# Patient Record
Sex: Male | Born: 1970 | Race: Black or African American | Hispanic: No | Marital: Single | Smoking: Current every day smoker
Health system: Southern US, Community
[De-identification: ages and names within clinical notes are randomized; demographics above are authoritative.]

## PROBLEM LIST (undated history)

## (undated) ENCOUNTER — Emergency Department (HOSPITAL_COMMUNITY): Admission: EM | Payer: Medicaid Other

## (undated) DIAGNOSIS — Z720 Tobacco use: Secondary | ICD-10-CM

## (undated) DIAGNOSIS — I10 Essential (primary) hypertension: Secondary | ICD-10-CM

## (undated) HISTORY — PX: ABDOMINAL SURGERY: SHX537

## (undated) HISTORY — DX: Tobacco use: Z72.0

## (undated) HISTORY — DX: Essential (primary) hypertension: I10

---

## 2019-08-21 ENCOUNTER — Encounter: Payer: Self-pay | Admitting: Family Medicine

## 2019-08-21 DIAGNOSIS — Z0289 Encounter for other administrative examinations: Secondary | ICD-10-CM | POA: Insufficient documentation

## 2019-08-22 ENCOUNTER — Other Ambulatory Visit: Payer: Self-pay

## 2019-08-22 ENCOUNTER — Ambulatory Visit (INDEPENDENT_AMBULATORY_CARE_PROVIDER_SITE_OTHER): Payer: Medicaid Other | Admitting: Family Medicine

## 2019-08-22 VITALS — BP 198/100 | HR 93 | Ht 71.26 in | Wt 136.8 lb

## 2019-08-22 DIAGNOSIS — Z720 Tobacco use: Secondary | ICD-10-CM

## 2019-08-22 DIAGNOSIS — Z0289 Encounter for other administrative examinations: Secondary | ICD-10-CM

## 2019-08-22 DIAGNOSIS — I10 Essential (primary) hypertension: Secondary | ICD-10-CM | POA: Insufficient documentation

## 2019-08-22 DIAGNOSIS — S6991XD Unspecified injury of right wrist, hand and finger(s), subsequent encounter: Secondary | ICD-10-CM | POA: Diagnosis not present

## 2019-08-22 LAB — POCT UA - MICROSCOPIC ONLY

## 2019-08-22 LAB — POCT URINALYSIS DIP (MANUAL ENTRY)
Bilirubin, UA: NEGATIVE
Glucose, UA: 100 mg/dL — AB
Ketones, POC UA: NEGATIVE mg/dL
Leukocytes, UA: NEGATIVE
Nitrite, UA: NEGATIVE
Protein Ur, POC: NEGATIVE mg/dL
Spec Grav, UA: 1.02 (ref 1.010–1.025)
Urobilinogen, UA: 0.2 E.U./dL
pH, UA: 7 (ref 5.0–8.0)

## 2019-08-22 MED ORDER — LOSARTAN POTASSIUM 25 MG PO TABS: 25.0000 mg | ORAL_TABLET | Freq: Every day | ORAL | 3 refills | Status: DC

## 2019-08-22 MED ORDER — INDAPAMIDE 2.5 MG PO TABS: 2.5000 mg | ORAL_TABLET | Freq: Every day | ORAL | 3 refills | Status: DC

## 2019-08-22 NOTE — Patient Instructions (Addendum)
Please call one of the dentists listed for an appointment  I will call you with an interpreter with labs  Please go to [facility] to get an x-ray of your wrist--- you can go anytime  It was wonderful to see you today.  Thank you for choosing [facility].   Please call [phone] with any questions about today's appointment.  Please be sure to schedule follow up at the front  desk before you leave today.   Dorris Singh, MD  Family Medicine    Your medications are at your pharmacy   You have follow up on November 3rd at 1:30 PM

## 2019-08-22 NOTE — Assessment & Plan Note (Signed)
Counseled and discussed regarding recommendation to quit.  Continue to encourage cessation.  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

## 2019-08-22 NOTE — Progress Notes (Signed)
  Patient Name: Edward Mooney Date of Birth: 08-11-1971 Date of Visit: 08/22/19 PCP: Martyn Malay, MD  Chief Complaint: establish care, hand pain   Subjective: Edward Mooney Ewart is a pleasant 48 y.o. with medical history significant for hypertension and tobacco abuse presenting today for hypertension. Patient seen alongside student doctor Thakkar.  The patient speaks Arabic as their primary language.  An interpreter was used for the entire visit.   He is RHD, has a history of a fracture of the right wrist. Has some residual pain. No swelling, no redness. He has tried nothing for the discomfort he has with repetitive motions.   He has a longstanding history of hypertension. He has been off medications. No headache, vision changes, CP, dyspnea.    See updated social history tab. Single refugee from [place], sought refuge in [place].   ROS: Per HPI.   I have reviewed the patient's medical, surgical, family, and social history as appropriate.  Vitals:   08/22/19 1405  BP: (!) 198/100  Pulse: 93  SpO2: 100%    HEENT: Sclera anicteric. Dentition is poor, no ulcers. Appears well hydrated. Neck: Supple Cardiac: Regular rate and rhythm. Normal S1/S2. No murmurs, rubs, or gallops appreciated. Lungs: Clear bilaterally to ascultation.  MSK exam No deformity, erythema, edema Elbow and shoulder WNL Unable to elicit any tenderness, including along distal radius and snuff box  Normal ROM of wrist   Tobacco abuse Counseled and discussed regarding recommendation to quit.  Continue to encourage cessation.  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  Essential hypertension Started indapamide 2.5 mg and losartan 25.  Will increase losartan at follow-up.  Suspect he may be need addition of a third agent.  Smoking cessation would also be useful.  Lipid panel today.  Dental disease, numbers provided for dental care.Bleeding likely  related to gingivitis.  Return to care in 2 weeks for BP check.   Dorris Singh, MD  Family Medicine Teaching Service

## 2019-08-22 NOTE — Assessment & Plan Note (Signed)
Started indapamide 2.5 mg and losartan 25.  Will increase losartan at follow-up.  Suspect he may be need addition of a third agent.  Smoking cessation would also be useful.  Lipid panel today.

## 2019-08-22 NOTE — Progress Notes (Addendum)
Patient Name: Edward Mooney Date of Birth: [birth year] Date of Visit: 08/22/19 PCP: Martyn Malay, MD  Chief Complaint: refugee intake examination and establish care   The patient's preferred language is Arabic. An interpreter was used for the entire visit.  Interpreter Name or ID: Raouf    Subjective: [employer] Edward Mooney is a pleasant 48 y.o. presenting today for an initial refugee and immigrant clinic visit.   Hypertension:  Patient was diagnosed with hypertension during his overseas exam on 07/03/19. He was started on bisprolol fumarate (2.5 mg, 1x daily) and Losardex (HCTX, 12.5 mg & Losartan, 50 mg; 1x daily) at that time. He has not taken blood pressure medication since arriving to the [place] on 07/26/19. Blood pressure in-clinic was elevated at 198/100 today. Denies chest pain, back pain, edema, dyspnea, claudication, vision changes, headache.   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 Wrist was casted for 3 months following the injury. He states that wrist felt fine after the cast was removed, but he occasionally feels pain when lifting heavy objects. Denies pain to wrist palpation. Pain is not worse at a particular time of day. No other areas of joint pain noted.   Reduced Appetite: Patient reports reduced appetite for 4-6 months preceding arrival to the [place]. 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 He notes that appetite has improved while in the [place]. Denies weight loss, nausea, vomiting, fever, or diarrhea.   Gum Bleed:  Patient has experienced gum bleeding while his brushing teeth for the past 2 months. He reports a 20 pack-year smoking history. No gum pain or voice changes noted. He brushes his teeth 2x daily with a firm bristle brush. He did not have a dentist prior to arrival to the [place] and would like  dental referral information today.   ROS: per HPI   PMH: Patient has a past medical history significant for hypertension and one episode of malaria as a child in [place]. Hypertension has been poorly managed since arriving to the [place] (see HPI for additional history). Malaria was managed with medication (chloroquine). No hospitalizations noted. No known drug allergies.   PSH: Patient reports abdominal surgery as a prisoner in [place]. Surgery took place in 2000. Points to right upper and lower quadrants when asked to localize procedure. He reports vomiting after receiving his daily meals while in prison. Emesis consisted of food. Denies blood or bilious vomiting. Patient was tolerating fluids at that time. He is unsure of what type of surgery he received but is confident nothing was removed. Symptoms resolved following procedure and patient was able to eat solid foods without vomiting.  SocHx: Patient has smoked 1 ppd for the past 20 years. Drinks 6-7 beers and 1-2 glasses of wine per week. He has no immediate family members in the [place] and is not married. Receives food support from the [employer]. He is also taking courses at the [employer], which has helped him make a few friends. Since arriving to [facility], he has connected with the local [place] population and established some community bonds. Patient also previously worked several jobs in [employer], [employer], and a [place]. He is interested in finding local work soon.     FH: No medical diagnoses noted.    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) Duration in [place]: 11-15 years Reason for  Leaving Home Country: [place] Language: Sudanese(Nubian) Able to Read in Primary Language: Yes Able to Write in Primary Language: Yes Education: [employer] Prior Work: Holiday representative; Factory; Recruitment consultant Marital Status: Single Sexual Activity: No Tuberculosis Screening Overseas: Negative Health Department Labs Completed: No History of Trauma: None Do You Feel Jumpy or Nervous?: No Are You Very Watchful or 'Super Alert'?: No Date of Overseas Exam: 07/03/19 Review of Overseas Exam: Normal -- normal CXR and RPR Pre-Departure Treatment: hypertension   Vitals:   08/22/19 1405  BP: (!) 198/100  Pulse: 93  SpO2: 100%   HEENT: Sclera anicteric. Dentition is poor (plaques noted on lower central incisors). Appears well hydrated. Neck: Supple Cardiac: Regular rate and rhythm. Normal S1, Prominent S2. No murmurs, rubs, or gallops appreciated. Lungs: Clear bilaterally to ascultation.  Abdomen: Normoactive bowel sounds. No tenderness to deep or light palpation. No rebound or guarding. Splenomegaly absent.  Extremities: Warm, well perfused without edema.  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  Labs labs per orders. Referral to dentistry placed.   Poor appetite, continue to monitor weight.  He should have a colonoscopy at the beginning of this year.  Will obtain LFTs and TSH evaluate for possible underlying hepatic or thyroid disease.  Will obtain CBC to evaluate for potential anemia as a marker of underlying disease.  Tobacco abuse Counseled and discussed regarding  recommendation to quit.  Continue to encourage cessation.  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  Essential hypertension Started indapamide 2.5 mg and losartan 25.  Will increase losartan at follow-up.  Suspect he may be need addition of a third agent.  Smoking cessation would also be useful.  Lipid panel today.  Injury of right wrist, subsequent encounter possibly had snuffbox injury initially will get x-ray to evaluate for long-term damages or avascular necrosis.  Low threshold to refer to hand surgery at follow-up -     DG Wrist Complete Right; Future   Return to care in 1 month in [facility] with resident physician and PCP Dr. Terisa Starr.   Vaccines: not administered during today's visit   Lyda Kalata, Medical Student  Patient seen along with MS3 student Lyda Kalata, MSIII. I personally evaluated this patient along with the student, and verified all aspects of the history, physical exam, and medical decision making as documented by the student. I agree with the student's documentation and have made all necessary edits.

## 2019-08-25 ENCOUNTER — Ambulatory Visit
Admission: RE | Admit: 2019-08-25 | Discharge: 2019-08-25 | Disposition: A | Discharge status: Home / Self Care | Payer: Medicaid Other | Source: Ambulatory Visit | Attending: Family Medicine | Admitting: Family Medicine

## 2019-08-25 ENCOUNTER — Other Ambulatory Visit: Payer: Self-pay

## 2019-08-25 ENCOUNTER — Encounter: Payer: Self-pay | Admitting: Family Medicine

## 2019-08-25 DIAGNOSIS — S6991XD Unspecified injury of right wrist, hand and finger(s), subsequent encounter: Secondary | ICD-10-CM

## 2019-08-25 LAB — CBC WITH DIFFERENTIAL/PLATELET
Basophils Absolute: 0 10*3/uL (ref 0.0–0.2)
Basos: 1 %
EOS (ABSOLUTE): 0.1 10*3/uL (ref 0.0–0.4)
Eos: 4 %
Hematocrit: 42 % (ref 37.5–51.0)
Hemoglobin: 14.3 g/dL (ref 13.0–17.7)
Immature Grans (Abs): 0 10*3/uL (ref 0.0–0.1)
Immature Granulocytes: 0 %
Lymphocytes Absolute: 1.2 10*3/uL (ref 0.7–3.1)
Lymphs: 33 %
MCH: 31.2 pg (ref 26.6–33.0)
MCHC: 34 g/dL (ref 31.5–35.7)
MCV: 92 fL (ref 79–97)
Monocytes Absolute: 0.4 10*3/uL (ref 0.1–0.9)
Monocytes: 11 %
Neutrophils Absolute: 2 10*3/uL (ref 1.4–7.0)
Neutrophils: 51 %
Platelets: 184 10*3/uL (ref 150–450)
RBC: 4.59 x10E6/uL (ref 4.14–5.80)
RDW: 14.8 % (ref 11.6–15.4)
WBC: 3.8 10*3/uL (ref 3.4–10.8)

## 2019-08-25 LAB — QUANTIFERON-TB GOLD PLUS
QuantiFERON Mitogen Value: 7.03 IU/mL
QuantiFERON Nil Value: 2.8 IU/mL
QuantiFERON TB1 Ag Value: >10 IU/mL
QuantiFERON TB2 Ag Value: >10 IU/mL
QuantiFERON-TB Gold Plus: POSITIVE — AB

## 2019-08-25 LAB — HCV COMMENT:

## 2019-08-25 LAB — LIPID PANEL
Chol/HDL Ratio: 2 ratio (ref 0.0–5.0)
Cholesterol, Total: 161 mg/dL (ref 100–199)
HDL: 81 mg/dL (ref >39)
LDL Chol Calc (NIH): 61 mg/dL (ref 0–99)
Triglycerides: 107 mg/dL (ref 0–149)
VLDL Cholesterol Cal: 19 mg/dL (ref 5–40)

## 2019-08-25 LAB — COMPREHENSIVE METABOLIC PANEL
ALT: 24 IU/L (ref 0–44)
AST: 41 IU/L — ABNORMAL HIGH (ref 0–40)
Albumin/Globulin Ratio: 1.5 (ref 1.2–2.2)
Albumin: 4.8 g/dL (ref 4.0–5.0)
Alkaline Phosphatase: 94 IU/L (ref 39–117)
BUN/Creatinine Ratio: 10 (ref 9–20)
BUN: 8 mg/dL (ref 6–24)
Bilirubin Total: 0.9 mg/dL (ref 0.0–1.2)
CO2: 24 mmol/L (ref 20–29)
Calcium: 9.6 mg/dL (ref 8.7–10.2)
Chloride: 100 mmol/L (ref 96–106)
Creatinine, Ser: 0.82 mg/dL (ref 0.76–1.27)
GFR calc Af Amer: 120 mL/min/{1.73_m2} (ref >59)
GFR calc non Af Amer: 104 mL/min/{1.73_m2} (ref >59)
Globulin, Total: 3.1 g/dL (ref 1.5–4.5)
Glucose: 100 mg/dL — ABNORMAL HIGH (ref 65–99)
Potassium: 4.4 mmol/L (ref 3.5–5.2)
Sodium: 136 mmol/L (ref 134–144)
Total Protein: 7.9 g/dL (ref 6.0–8.5)

## 2019-08-25 LAB — HEPATITIS B SURFACE ANTIGEN: Hepatitis B Surface Ag: NEGATIVE

## 2019-08-25 LAB — RPR: RPR Ser Ql: NONREACTIVE

## 2019-08-25 LAB — TSH: TSH: 1.82 u[IU]/mL (ref 0.450–4.500)

## 2019-08-25 LAB — HEPATITIS B CORE ANTIBODY, TOTAL: Hep B Core Total Ab: NEGATIVE

## 2019-08-25 LAB — VARICELLA ZOSTER ANTIBODY, IGG: Varicella zoster IgG: 2661 index (ref >165)

## 2019-08-25 LAB — HEPATITIS B SURFACE ANTIBODY, QUANTITATIVE: Hepatitis B Surf Ab Quant: 406.6 m[IU]/mL (ref >9.9)

## 2019-08-25 LAB — HEPATITIS C ANTIBODY (REFLEX): HCV Ab: 0.1 s/co ratio (ref 0.0–0.9)

## 2019-08-25 LAB — HIV ANTIBODY (ROUTINE TESTING W REFLEX): HIV Screen 4th Generation wRfx: NONREACTIVE

## 2019-08-26 ENCOUNTER — Encounter: Payer: Self-pay | Admitting: Family Medicine

## 2019-09-01 ENCOUNTER — Other Ambulatory Visit: Payer: Self-pay | Admitting: Emergency Medicine

## 2019-09-01 ENCOUNTER — Other Ambulatory Visit: Payer: Self-pay

## 2019-09-01 ENCOUNTER — Ambulatory Visit
Admission: RE | Admit: 2019-09-01 | Discharge: 2019-09-01 | Disposition: A | Discharge status: Home / Self Care | Payer: No Typology Code available for payment source | Source: Ambulatory Visit | Attending: Internal Medicine | Admitting: Internal Medicine

## 2019-09-01 DIAGNOSIS — R7611 Nonspecific reaction to tuberculin skin test without active tuberculosis: Secondary | ICD-10-CM

## 2019-09-05 ENCOUNTER — Other Ambulatory Visit: Payer: Self-pay

## 2019-09-05 ENCOUNTER — Ambulatory Visit: Payer: Medicaid Other

## 2019-09-05 ENCOUNTER — Ambulatory Visit (INDEPENDENT_AMBULATORY_CARE_PROVIDER_SITE_OTHER): Payer: Medicaid Other | Admitting: Family Medicine

## 2019-09-05 VITALS — BP 192/120 | HR 95 | Wt 140.4 lb

## 2019-09-05 DIAGNOSIS — E785 Hyperlipidemia, unspecified: Secondary | ICD-10-CM | POA: Diagnosis present

## 2019-09-05 DIAGNOSIS — Z23 Encounter for immunization: Secondary | ICD-10-CM | POA: Diagnosis not present

## 2019-09-05 DIAGNOSIS — I10 Essential (primary) hypertension: Secondary | ICD-10-CM | POA: Diagnosis not present

## 2019-09-05 MED ORDER — ATORVASTATIN CALCIUM 20 MG PO TABS: 20.0000 mg | ORAL_TABLET | Freq: Every day | ORAL | 3 refills | Status: DC

## 2019-09-05 NOTE — Assessment & Plan Note (Signed)
Given smoking status and hypertension recommend starting statin.  A moderate potency statin was started today.  We reviewed the side effects he is to take at night.  Instructed how to pick up the medication and refills from the pharmacy at length.

## 2019-09-05 NOTE — Patient Instructions (Addendum)
It was wonderful to see you today.  Thank you for choosing [facility].   Please call [phone] with any questions about today's appointment.  Please be sure to schedule follow up at the front  desk before you leave today.   Dorris Singh, MD  Family Medicine   Go to [facility]---- your medications are there for you to pick up.

## 2019-09-05 NOTE — Assessment & Plan Note (Signed)
At goal as he is currently not taking his medication.  We reviewed how to go to the lumbar pharmacy had pick up medications for pickup refills.  I also contacted his case manager with the patient's permission to help him get these medications.

## 2019-09-05 NOTE — Progress Notes (Signed)
   Subjective:    Patient ID: Edward Mooney, male    DOB: 10/09/1971, 48 y.o.   MRN: [id number]  Date of Visit: 09/05/19 PCP: Martyn Malay, MD  Chief Complaint:  BP check, issues with medications  Subjective: Edward Mooney is a pleasant 48 y.o. with medical history significant for hypertension, tobacco abuse, and latent Tb presenting today for blood pressure check.   The patient speaks Arabic as their primary language.  An interpreter was used for the entire visit.   Hypertension The patient reports he has not been able to pick up his medications. He did not know to go to the pharmacy at [facility].  Denies signs or symptoms of hypertensive emergency including chest pain, vision changes, headache, dyspnea.   The patient smokes about a pack per day. He reports he is interested in quitting smoking.  He is determined to do this by himself.  He declines patches or gums at this time.  In terms of his labs these were reviewed in detail.  His QuantiFERON gold is positive.  His follow-up chest x-ray was negative.  He reports he is taking 2 pills once a week from health department.  He denies signs or symptoms of tuberculosis.  He is amenable to starting a statin given his elevated cholesterol level.  ROS: Per HPI.  Smoking status reviewed   Past medical history, surgical, family, and social history reviewed and updated in the EMR as appropriate. Reviewed problem list.   Objective:  BP (!) 192/120   Pulse 95   Wt 140 lb 6.4 oz (63.7 kg)   SpO2 100%   BMI 19.44 kg/m  BP noted, asymptomatic.  Vitals and nursing note reviewed  General: Alert and cooperative and appears to be in no acute distress HEENT: Neck non-tender without lymphadenopathy, masses or thyromegaly, poor dentition  Cardio: Normal S1 and S2, RRR. No murmurs or rubs.   Pulm: Clear to auscultation bilaterally, no crackles, wheezing, or diminished breath sounds. Normal respiratory effort Abdomen:  Abdomen soft and non-tender. Bowel sounds normal.  Extremities: No peripheral edema. Warm/ well perfused. Strong radial pulses. Neuro: Cranial nerves grossly intact, PERLA  Assessment & Plan:    Dyslipidemia Given smoking status and hypertension recommend starting statin.  A moderate potency statin was started today.  We reviewed the side effects he is to take at night.  Instructed how to pick up the medication and refills from the pharmacy at length.  Essential hypertension At goal as he is currently not taking his medication.  We reviewed how to go to the lumbar pharmacy had pick up medications for pickup refills.  I also contacted his case manager with the patient's permission to help him get these medications.  Orders Placed This Encounter  Procedures  . Flu Vaccine QUAD 36+ mos IM  . Tdap vaccine greater than or equal to 7yo IM    Return to care in 2-3 weeks for follow up.   Lattie Haw, MD  [facility] PGY-1  Patient seen with Dr. Posey Pronto.  I discussed the plan of care with the resident physician and agree with below documentation.  Dorris Singh, MD

## 2019-09-27 ENCOUNTER — Ambulatory Visit: Payer: Medicaid Other | Admitting: Family Medicine

## 2019-10-24 ENCOUNTER — Encounter: Payer: Self-pay | Admitting: Family Medicine

## 2019-10-24 ENCOUNTER — Ambulatory Visit: Payer: Medicaid Other | Admitting: Family Medicine

## 2019-10-24 ENCOUNTER — Other Ambulatory Visit: Payer: Self-pay

## 2019-10-24 VITALS — BP 148/86 | HR 87 | Wt 139.0 lb

## 2019-10-24 DIAGNOSIS — I10 Essential (primary) hypertension: Secondary | ICD-10-CM | POA: Diagnosis not present

## 2019-10-24 DIAGNOSIS — Z720 Tobacco use: Secondary | ICD-10-CM | POA: Diagnosis present

## 2019-10-24 DIAGNOSIS — E785 Hyperlipidemia, unspecified: Secondary | ICD-10-CM

## 2019-10-24 DIAGNOSIS — Z23 Encounter for immunization: Secondary | ICD-10-CM

## 2019-10-24 NOTE — Patient Instructions (Addendum)
Hi Edward Mooney!  It was wonderful to see you today! You're blood pressure is so much better. well done for taking your medications. Continue the blood pressure medications as normal. Today we also spoke about reducing the number of cigarettes you smoke as it can help reduce your blood pressure.  I am here if you need further help in helping quit smoking.   Happy holidays and best wishes,  Dr Lattie Haw

## 2019-10-24 NOTE — Assessment & Plan Note (Signed)
Provided smoking cessation counseling and advised pt that smoking increases the risk of cancer, heart disease, HTN, stroke etc. Offered patches, gum etc. Patient would like to try cessation by himself. If he is unable to do it himself he will seek medical advice.

## 2019-10-24 NOTE — Progress Notes (Signed)
   Subjective:    Patient ID: Edward Mooney, male    DOB: 03-21-1971, 48 y.o.   MRN: [id number]  Arabic speaking interpretor was used for this encounter  CC: [name] is a 48 yr old male who presents today for a BP check and follow up   HPI:  HTN BP today 148/86 improved 192/120 last visit. Access to medications was difficult on prior visit as pt speaks Arabic and was unaware he could get his medications at [facility]. Indapamide 2.5mg  once daily and Losartan 25mg  once daily.  Pt has compliant with medications, denies side effects. Does not checks BP at home. Denies chest pain, peripheral edema or palpitations.  Hypercholesterolemia  Takes Atorvastatin 20mg  once daily.Compliant. -No muscle aches, no RUQ pain   Smoking Has smoked 6-7 cigarettes daily for many years.   Smoking status reviewed   ROS: pertinent noted in the HPI    Past medical history, surgical, family, and social history reviewed and updated in the EMR as appropriate. Reviewed problem list.   Objective:  BP (!) 148/86   Pulse 87   Wt 139 lb (63 kg)   SpO2 98%   BMI 19.25 kg/m   Vitals and nursing note reviewed  General: NAD, pleasant, able to participate in exam Cardiac: RRR, S1 S2 present. normal heart sounds, no murmurs. Respiratory: CTAB, normal effort, No wheezes, rales or rhonchi Extremities: no edema or cyanosis. Skin: warm and dry, no rashes noted Neuro: alert, no obvious focal deficits Psych: Normal affect and mood   Assessment & Plan:    Essential hypertension BP at goal. Continue Indapamide and Losartan. BMP today to check renal function.   Tobacco abuse Provided smoking cessation counseling and advised pt that smoking increases the risk of cancer, heart disease, HTN, stroke etc. Offered patches, gum etc. Patient would like to try cessation by himself. If he is unable to do it himself he will seek medical advice.   Dyslipidemia Continue statin.   Pt given  Pneumococcal polysaccharide vaccine 23.  Lattie Haw, MD  [facility] PGY-1

## 2019-10-24 NOTE — Assessment & Plan Note (Signed)
Continue statin. 

## 2019-10-24 NOTE — Assessment & Plan Note (Addendum)
BP at goal. Continue Indapamide and Losartan. BMP today to check renal function.

## 2019-10-25 ENCOUNTER — Encounter: Payer: Self-pay | Admitting: Family Medicine

## 2019-10-25 LAB — BASIC METABOLIC PANEL
BUN/Creatinine Ratio: 16 (ref 9–20)
BUN: 14 mg/dL (ref 6–24)
CO2: 25 mmol/L (ref 20–29)
Calcium: 9.7 mg/dL (ref 8.7–10.2)
Chloride: 96 mmol/L (ref 96–106)
Creatinine, Ser: 0.85 mg/dL (ref 0.76–1.27)
GFR calc Af Amer: 118 mL/min/{1.73_m2} (ref >59)
GFR calc non Af Amer: 102 mL/min/{1.73_m2} (ref >59)
Glucose: 104 mg/dL — ABNORMAL HIGH (ref 65–99)
Potassium: 3.5 mmol/L (ref 3.5–5.2)
Sodium: 135 mmol/L (ref 134–144)

## 2019-12-07 ENCOUNTER — Encounter: Payer: Self-pay | Admitting: Family Medicine

## 2019-12-07 DIAGNOSIS — Z227 Latent tuberculosis: Secondary | ICD-10-CM | POA: Insufficient documentation

## 2020-04-03 ENCOUNTER — Telehealth: Payer: Self-pay | Admitting: *Deleted

## 2020-04-03 NOTE — Telephone Encounter (Signed)
-----   Message from Westley Chandler, MD sent at 04/02/2020 11:17 AM EDT ----- Regarding: Follow up Hi Red Team,  Please schedule this patient for follow up with me for Blood pressure in the next 1-2 months.  Thanks, Terisa Starr, MD  [facility] Medicine Teaching Service

## 2020-04-03 NOTE — Telephone Encounter (Signed)
Pt scheduled and informed using arabic interpreter aeham [phone]. Ashely Joshua Bruna Potter, CMA

## 2020-05-17 ENCOUNTER — Encounter: Payer: Self-pay | Admitting: Family Medicine

## 2020-05-17 ENCOUNTER — Other Ambulatory Visit: Payer: Self-pay

## 2020-05-17 ENCOUNTER — Ambulatory Visit: Payer: Medicaid Other | Admitting: Family Medicine

## 2020-05-17 VITALS — BP 126/82 | HR 93 | Wt 137.6 lb

## 2020-05-17 DIAGNOSIS — E785 Hyperlipidemia, unspecified: Secondary | ICD-10-CM

## 2020-05-17 DIAGNOSIS — Z1211 Encounter for screening for malignant neoplasm of colon: Secondary | ICD-10-CM

## 2020-05-17 DIAGNOSIS — I10 Essential (primary) hypertension: Secondary | ICD-10-CM

## 2020-05-17 DIAGNOSIS — E781 Pure hyperglyceridemia: Secondary | ICD-10-CM

## 2020-05-17 DIAGNOSIS — Z1212 Encounter for screening for malignant neoplasm of rectum: Secondary | ICD-10-CM

## 2020-05-17 NOTE — Assessment & Plan Note (Signed)
Recommended reducing alcohol use, will obtain lipid panel for monitoring.

## 2020-05-17 NOTE — Patient Instructions (Addendum)
It was wonderful to see you today.  Please bring ALL of your medications with you to every visit.   Today we talked about:  - Checking you cholesterol  - Someone will be giving you a call to schedule this test  - Reduce your alcohol/beer intake   Thank you for choosing [facility].   Please call [phone] with any questions about today's appointment.  Please be sure to schedule follow up at the front  desk before you leave today.   Terisa Starr, MD  Family Medicine    I highly recommend the COVID vaccine. These vaccines have excellent safety data. Please ask ANY questions you may have.   [facility] has multiple sites for vaccination---appointments are encouraged (online or call the number below) but you can also just walk in.    [address] [place])  [place] [address] [place], [place] [address] Hours: Mon,Thu 8-5, Sat 8-12 Type: [employer]   [address]  [facility] [place])  [place] [employer] [address] [place], [place] [address] Hours: Thu: 1-5 Type: Pfizer & Moderna  Phone Assistance: Please call [phone], Monday through Friday between 7 a.m. and 7 p.m.  NO ID Required For Vaccine Clinics: Vaccine providers may ask for an ID for insurance or HRSA reimbursement purposes. However, everyone will be vaccinated even if they don't present an ID. No one will be turned away.   Other Vaccine Locations: Find all vaccine locations throughout the state of [place] at [email or web address]

## 2020-05-17 NOTE — Progress Notes (Signed)
    SUBJECTIVE:   CHIEF COMPLAINT / HPI:   The patient speaks Arabic  as their primary language.  An interpreter was used for the entire visit.   Edward Mooney is a pleasant 49 year old gentleman with history of hypertension, dyslipidemia, and tobacco abuse presenting for follow up.  Today, he reports overall doing okay. His family may come over later this year.  He is interested in a new Covid vaccine.  He is currently working at [employer]  He is taking his antihypertensives as prescribed without side effect. Does not need refills. No chest pain, dizziness, dyspnea on exertion.   He is taking his statin as prescribed without side effects. Does not need refills. No muscle aches.   PERTINENT  PMH / PSH/Family/Social History :  Hypertension at goal Dyslipidemia Alcohol use had a DUI.  He drinks 4-5 beers in the weekends.     OBJECTIVE:   BP 126/82   Pulse 93   Wt 137 lb 9.6 oz (62.4 kg)   SpO2 99%   BMI 19.05 kg/m   HEENT: Sclera anicteric. Dentition is moderate. Appears well hydrated. Neck: Supple Cardiac: Regular rate and rhythm. Normal S1/S2. No murmurs, rubs, or gallops appreciated. Lungs: Clear bilaterally to ascultation.  Abdomen: Normoactive bowel sounds. No tenderness to deep or light palpation. No rebound or guarding.  Extremities: Warm, well perfused without edema.  Skin: Warm, dry Psych: Pleasant and appropriate    ASSESSMENT/PLAN:   Essential hypertension Reviewed current medications.  He is taking indapamide and losartan and tolerating well.  He endorses no side effects. Blood pressure at goal.  Dyslipidemia Recommended reducing alcohol use, will obtain lipid panel for monitoring.     HCM - Hep A, Hep B, Tdap at follow up - COVID information given today - Colonoscopy ordered, discussed  - Follow up 3 months- discuss tobacco use  at follow up   Terisa Starr, MD  Family Medicine Teaching Service  [facility]

## 2020-05-17 NOTE — Assessment & Plan Note (Addendum)
Reviewed current medications.  He is taking indapamide and losartan and tolerating well.  He endorses no side effects. Blood pressure at goal.

## 2020-05-18 LAB — LIPID PANEL
Chol/HDL Ratio: 2 ratio (ref 0.0–5.0)
Cholesterol, Total: 143 mg/dL (ref 100–199)
HDL: 72 mg/dL (ref >39)
LDL Chol Calc (NIH): 49 mg/dL (ref 0–99)
Triglycerides: 131 mg/dL (ref 0–149)
VLDL Cholesterol Cal: 22 mg/dL (ref 5–40)

## 2020-05-22 ENCOUNTER — Encounter: Payer: Self-pay | Admitting: Family Medicine

## 2020-07-09 IMAGING — CR DG CHEST 1V
1 series · 1 of 1 positions shown · non-contrast
Comparison: None.

CLINICAL DATA: Positive PPD.

EXAM:
CHEST  1 VIEW

[w chest pa]
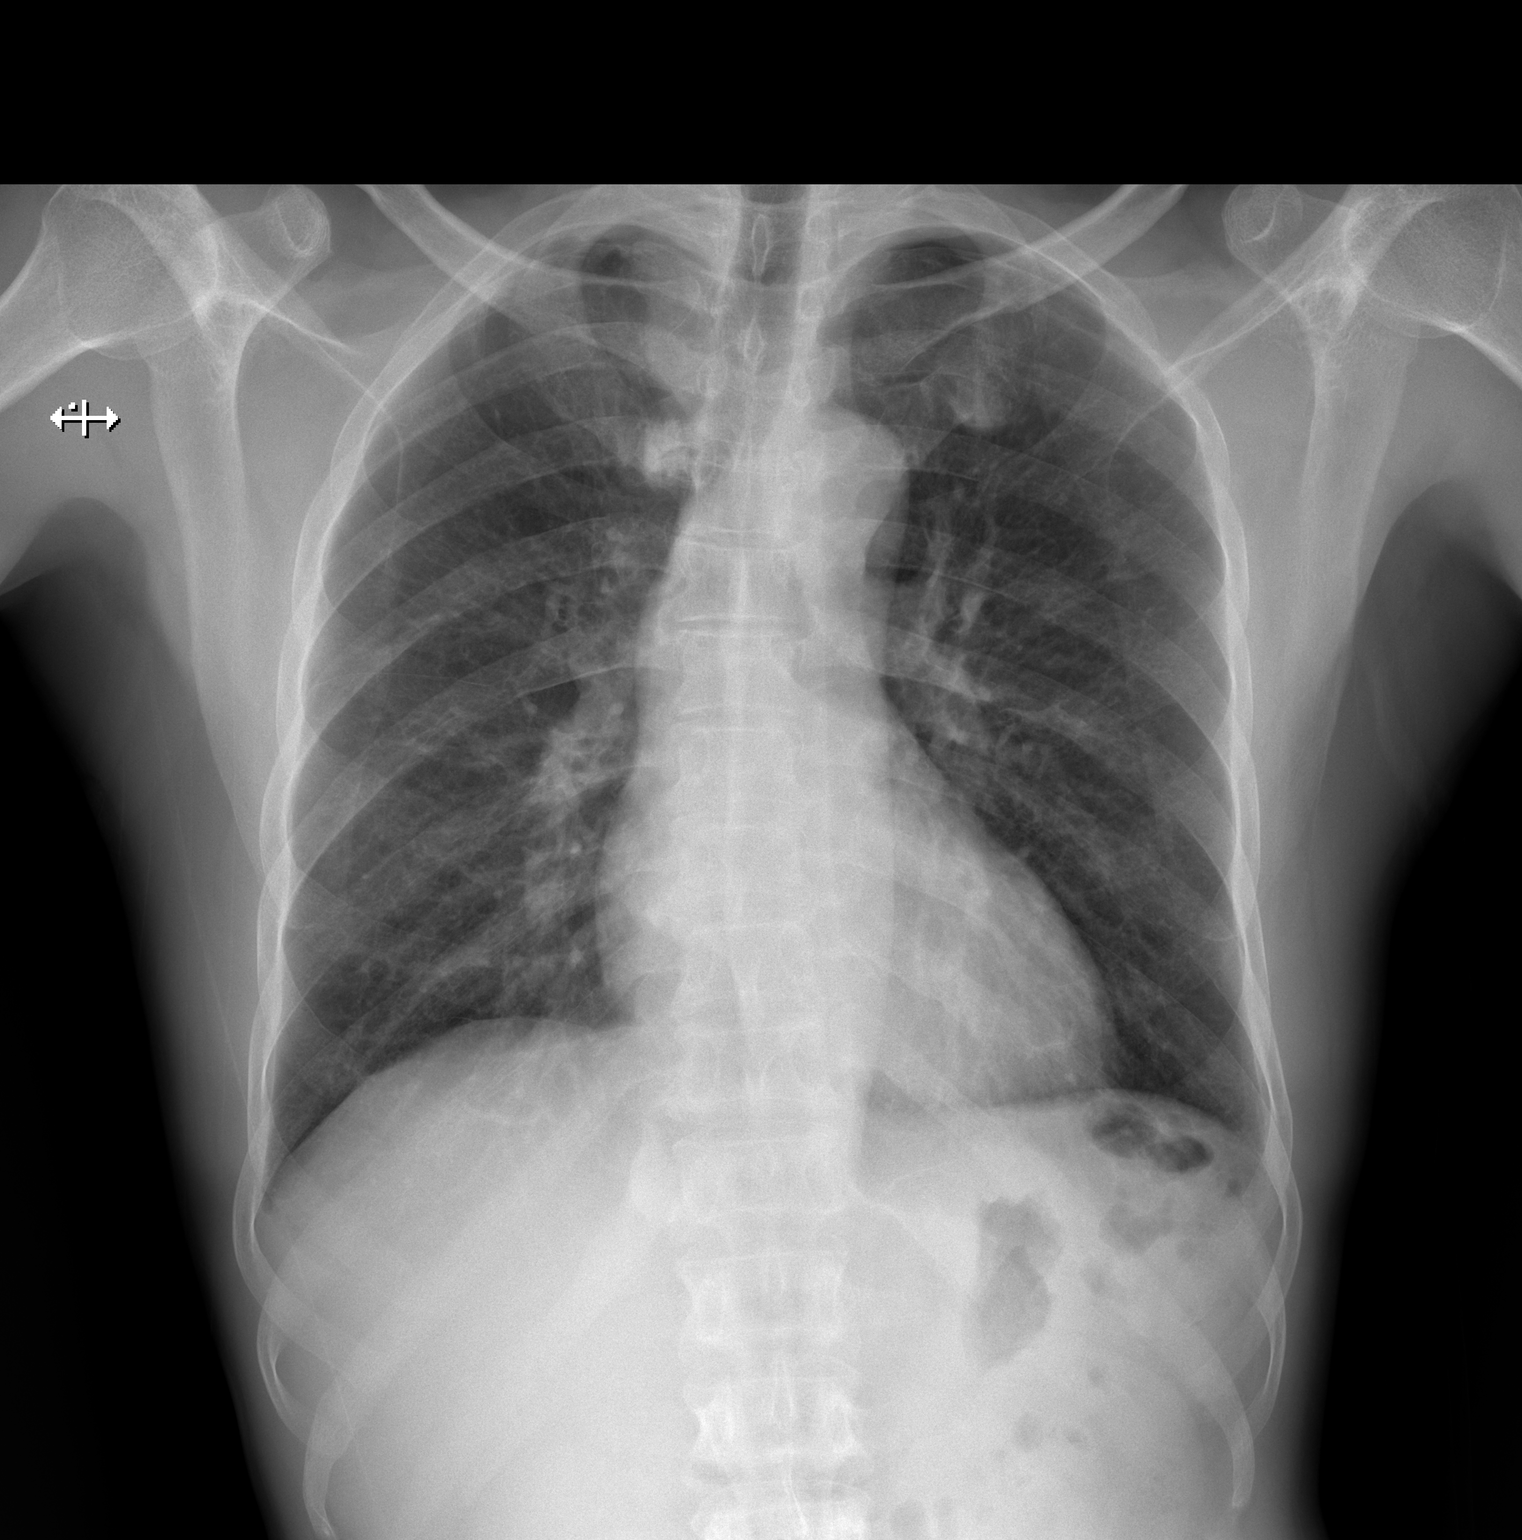

[1 of 1 positions shown; findings below may reference images not displayed]

FINDINGS: Normal sized heart. Mildly tortuous aorta. Clear lungs. Mild
dextroconvex thoracic scoliosis.
IMPRESSION: No acute abnormality. No evidence of active or previous tuberculosis
infection.

## 2020-09-09 ENCOUNTER — Other Ambulatory Visit: Payer: Self-pay

## 2020-09-09 ENCOUNTER — Ambulatory Visit (INDEPENDENT_AMBULATORY_CARE_PROVIDER_SITE_OTHER): Payer: Self-pay | Admitting: Family Medicine

## 2020-09-09 ENCOUNTER — Encounter: Payer: Self-pay | Admitting: Family Medicine

## 2020-09-09 VITALS — BP 180/100 | HR 93 | Wt 143.2 lb

## 2020-09-09 DIAGNOSIS — Z720 Tobacco use: Secondary | ICD-10-CM

## 2020-09-09 DIAGNOSIS — I10 Essential (primary) hypertension: Secondary | ICD-10-CM

## 2020-09-09 MED ORDER — LOSARTAN POTASSIUM 25 MG PO TABS: 25.0000 mg | ORAL_TABLET | Freq: Every day | ORAL | 3 refills | Status: DC

## 2020-09-09 MED ORDER — INDAPAMIDE 2.5 MG PO TABS: 2.5000 mg | ORAL_TABLET | Freq: Every day | ORAL | 3 refills | Status: DC

## 2020-09-09 MED ORDER — ATORVASTATIN CALCIUM 20 MG PO TABS: 20.0000 mg | ORAL_TABLET | Freq: Every day | ORAL | 3 refills | Status: DC

## 2020-09-09 NOTE — Assessment & Plan Note (Signed)
Discussed quit strategies including patches, gums and briefly to medication options.  He is interested in trying to quit by himself or at least cut down alone now.  We discussed strategies including chewing gum, using carrot sticks and wrapping of her bands around the cigarette packs.

## 2020-09-09 NOTE — Progress Notes (Signed)
    SUBJECTIVE:   CHIEF COMPLAINT / HPI:   The patient speaks Arabic as their primary language.  An interpreter was used for the entire visit.   [name] is a pleasant 49 year old gentleman with history of hypertension, tobacco abuse, latent TB and intermittent alcohol use presenting today for follow-up.  The patient reports he is interested in cutting down on his tobacco use.  He has limited alcohol he reports.  He still smokes a pack a day.  He is not particular interested in medications or patches.  He is interested in quitting.  The patient reports he has been out of his blood pressure medications for 2 weeks.  He denies chest pain, vision changes, palpitations, dyspnea or lower extremity edema.  He reports he is now urinating a normal amount.  He reports the reason he is noncompliant thought due to cost but to the fact that the pharmacy did not have any more of his pills and they told him to come see his physician.  The patient continues to work at [employer].   PERTINENT  PMH / PSH/Family/Social History : updated and reviewed-of note, he is currently uninsured it appears   OBJECTIVE:   BP (!) 180/100   Pulse 93   Wt 143 lb 3.2 oz (65 kg)   SpO2 99%   BMI 19.83 kg/m   Today's weight:  Last Weight  Most recent update: 09/09/2020  9:46 AM   Weight  65 kg (143 lb 3.2 oz)           Review of prior weights: Filed Weights   09/09/20 0944  Weight: 143 lb 3.2 oz (65 kg)    Cardiac: Regular rate and rhythm. Normal S1/S2. No murmurs, rubs, or gallops appreciated. Lungs: Clear bilaterally to ascultation.  Abdomen: Normoactive bowel sounds. No tenderness to deep or light palpation. No rebound or guarding.    Psych: Pleasant and appropriate     ASSESSMENT/PLAN:   Essential hypertension Not at goal.  He was previously on HCTZ and losartan.  Refilled both of these medications today.  He will follow-up in 1 week with Dr. Miquel Dunn for metabolic panel and repeat blood pressure check.   The patient's blood pressure is not at goal due to nonadherence as he ran out of medications.  We discussed how to ask for refills and how to call our clinic and asked for an interpreter.  Tobacco abuse Discussed quit strategies including patches, gums and briefly to medication options.  He is interested in trying to quit by himself or at least cut down alone now.  We discussed strategies including chewing gum, using carrot sticks and wrapping of her bands around the cigarette packs.    Healthcare maintenance Influenza vaccine not given today as he currently does not have insurance Will discuss colonoscopy and colorectal cancer screening as patient is able to gain insurance He will also likely meet criteria for low-dose CT Reports completed latent Tb treatment   At follow-up please obtain metabolic panel, A1c and recheck blood pressure.  Please have patient return to see me in December if possible.   Terisa Starr, MD  Family Medicine Teaching Service  [facility]

## 2020-09-09 NOTE — Assessment & Plan Note (Signed)
Not at goal.  He was previously on HCTZ and losartan.  Refilled both of these medications today.  He will follow-up in 1 week with Dr. Miquel Dunn for metabolic panel and repeat blood pressure check.  The patient's blood pressure is not at goal due to nonadherence as he ran out of medications.  We discussed how to ask for refills and how to call our clinic and asked for an interpreter.

## 2020-09-09 NOTE — Patient Instructions (Addendum)
It was wonderful to see you today.  Please bring ALL of your medications with you to every visit.   Today we talked about:  Follow up in 1 week with Dr. Miquel Dunn on Wednesday November 17th at 950 AM    Thank you for choosing [facility].   Please call [phone] with any questions about today's appointment.  Please be sure to schedule follow up at the front  desk before you leave today.   Terisa Starr, MD  Family Medicine

## 2020-09-18 ENCOUNTER — Ambulatory Visit (INDEPENDENT_AMBULATORY_CARE_PROVIDER_SITE_OTHER): Payer: Self-pay | Admitting: Family Medicine

## 2020-09-18 ENCOUNTER — Encounter: Payer: Self-pay | Admitting: Family Medicine

## 2020-09-18 ENCOUNTER — Other Ambulatory Visit: Payer: Self-pay

## 2020-09-18 VITALS — BP 178/104 | HR 80 | Ht 71.26 in | Wt 147.6 lb

## 2020-09-18 DIAGNOSIS — E785 Hyperlipidemia, unspecified: Secondary | ICD-10-CM

## 2020-09-18 DIAGNOSIS — I10 Essential (primary) hypertension: Secondary | ICD-10-CM

## 2020-09-18 NOTE — Patient Instructions (Addendum)
It was wonderful to see you today.  Please bring ALL of your medications with you to every visit.   Today we talked about:  - Keep taking your medications every day, and we will see you back next week to recheck your kidney function and your blood pressure - Reasons to go to the hospital including headaches, vision changes, numbness, weakness, chest pain, dyspnea   Thank you for choosing [facility].   Please call [phone] with any questions about today's appointment.  Please be sure to schedule follow up at the front  desk before you leave today.   Burley Saver, MD  Family Medicine

## 2020-09-18 NOTE — Progress Notes (Signed)
    SUBJECTIVE:   CHIEF COMPLAINT / HPI:    [name] is a very pleasant 49 yo male who presents for HTN follow-up. He was represcribed his indapamide and Cozaar last week but unfortunately only started taking 2 days ago, and today he has not taken his medications. He denies any headaches, vision changes, chest pain, dyspnea, leg swelling, dizziness. He has not had any side effects.   He is still working to get insurance. He has had flu shot one week ago at work and had COVID vaccines at work as well.    PERTINENT  PMH / PSH: HTN, dyslipidemia, latent TB s/p treatment, tobacco use  OBJECTIVE:   BP (!) 178/104   Pulse 80   Ht 5' 11.26" (1.81 m)   Wt 147 lb 9.6 oz (67 kg)   SpO2 100%   BMI 20.44 kg/m   General: A&O, NAD HEENT: No sign of trauma, PERRL, EOMI Cardiac: RRR, no m/r/g Respiratory: CTAB, normal WOB, no w/c/r GI: Soft, NTTP, non-distended  Extremities: NTTP, no peripheral edema. Neuro: Normal gait, moves all four extremities appropriately. Psych: Appropriate mood and affect   ASSESSMENT/PLAN:   Essential hypertension - discussed continuing to take his new medications daily, and to take them next week before he comes to his appointment, and we will plan to do BMP and A1c at the next appt - discussed reasons to call clinic or go to ER including HA, vision changes, weakness, numbness, chest pain, or dyspnea   He is calling his case manager and discussing with work ways to get insurance so he can get further healthcare maintenance items addressed.  Billey Co, MD [facility]

## 2020-09-18 NOTE — Assessment & Plan Note (Signed)
-   discussed continuing to take his new medications daily, and to take them next week before he comes to his appointment, and we will plan to do BMP and A1c at the next appt - discussed reasons to call clinic or go to ER including HA, vision changes, weakness, numbness, chest pain, or dyspnea

## 2020-09-24 NOTE — Progress Notes (Signed)
    SUBJECTIVE:   CHIEF COMPLAINT / HPI:   Edward Mooney presents for follow up blood pressure today. He has been taking his medications every day for the past week, took them today as well. Denies headaches, vision changes, chest pain, dyspnea, leg swelling.   He is working with his job to get [employer] He thinks he may need his immunization record for [place], he wants to discuss with his case manager first.  PERTINENT  PMH / PSH: HTN, dyslipidemia, tobacco use  OBJECTIVE:   BP 136/80   Pulse 90   Wt 144 lb (65.3 kg)   SpO2 98%   BMI 19.94 kg/m   General: A&O, NAD HEENT: No sign of trauma, EOM grossly intact Cardiac: RRR, no m/r/g Respiratory: CTAB, normal WOB, no w/c/r GI: non-distended  Extremities: NTTP, no peripheral edema. Neuro: Normal gait, moves all four extremities appropriately. Psych: Appropriate mood and affect   ASSESSMENT/PLAN:   Essential hypertension - normotensive today, will repeat BMP to ensure stable Cr, continue current medications - checking A1c today as well   Offered vaccine records copy, he will call or have case manager call with specifics of which paperwork is needed from [place]. I discussed I am more than happy to help fill out whatever he needs.  Billey Co, MD [facility]

## 2020-09-25 ENCOUNTER — Ambulatory Visit (INDEPENDENT_AMBULATORY_CARE_PROVIDER_SITE_OTHER): Payer: Self-pay | Admitting: Family Medicine

## 2020-09-25 ENCOUNTER — Encounter: Payer: Self-pay | Admitting: Family Medicine

## 2020-09-25 ENCOUNTER — Other Ambulatory Visit: Payer: Self-pay

## 2020-09-25 VITALS — BP 136/80 | HR 90 | Wt 144.0 lb

## 2020-09-25 DIAGNOSIS — I10 Essential (primary) hypertension: Secondary | ICD-10-CM

## 2020-09-25 LAB — POCT GLYCOSYLATED HEMOGLOBIN (HGB A1C): Hemoglobin A1C: 5.1 % (ref 4.0–5.6)

## 2020-09-25 NOTE — Patient Instructions (Signed)
It was wonderful to see you today.  Please bring ALL of your medications with you to every visit.   Today we talked about:  - Your blood pressure is well controlled today, continue your medications, will check your lab work today - F/u in one month on January 3rd at 8:15 AM with Dr Manson Passey   Thank you for choosing [facility].   Please call [phone] with any questions about today's appointment.  Please be sure to schedule follow up at the front  desk before you leave today.   Burley Saver, MD  Family Medicine

## 2020-09-25 NOTE — Assessment & Plan Note (Addendum)
-   normotensive today, will repeat BMP to ensure stable Cr, continue current losartan and indapamide - checking A1c today as well

## 2020-09-26 LAB — BASIC METABOLIC PANEL
BUN/Creatinine Ratio: 27 — ABNORMAL HIGH (ref 9–20)
BUN: 21 mg/dL (ref 6–24)
CO2: 25 mmol/L (ref 20–29)
Calcium: 9.9 mg/dL (ref 8.7–10.2)
Chloride: 99 mmol/L (ref 96–106)
Creatinine, Ser: 0.78 mg/dL (ref 0.76–1.27)
GFR calc Af Amer: 122 mL/min/{1.73_m2} (ref >59)
GFR calc non Af Amer: 105 mL/min/{1.73_m2} (ref >59)
Glucose: 86 mg/dL (ref 65–99)
Potassium: 3.3 mmol/L — ABNORMAL LOW (ref 3.5–5.2)
Sodium: 141 mmol/L (ref 134–144)

## 2020-11-04 ENCOUNTER — Ambulatory Visit: Payer: Self-pay | Admitting: Family Medicine

## 2021-04-11 ENCOUNTER — Ambulatory Visit: Payer: Self-pay | Admitting: Family Medicine

## 2021-10-14 ENCOUNTER — Other Ambulatory Visit: Payer: Self-pay

## 2021-10-14 ENCOUNTER — Emergency Department (HOSPITAL_COMMUNITY)
Admission: EM | Admit: 2021-10-14 | Discharge: 2021-10-14 | Disposition: A | Discharge status: Home / Self Care | Payer: Self-pay | Attending: Emergency Medicine | Admitting: Emergency Medicine

## 2021-10-14 ENCOUNTER — Encounter (HOSPITAL_COMMUNITY): Payer: Self-pay | Admitting: Emergency Medicine

## 2021-10-14 DIAGNOSIS — M5431 Sciatica, right side: Secondary | ICD-10-CM | POA: Insufficient documentation

## 2021-10-14 DIAGNOSIS — F17210 Nicotine dependence, cigarettes, uncomplicated: Secondary | ICD-10-CM | POA: Insufficient documentation

## 2021-10-14 DIAGNOSIS — Z79899 Other long term (current) drug therapy: Secondary | ICD-10-CM | POA: Insufficient documentation

## 2021-10-14 DIAGNOSIS — I10 Essential (primary) hypertension: Secondary | ICD-10-CM | POA: Insufficient documentation

## 2021-10-14 LAB — CBC WITH DIFFERENTIAL/PLATELET
Abs Immature Granulocytes: 0.01 10*3/uL (ref 0.00–0.07)
Basophils Absolute: 0.1 10*3/uL (ref 0.0–0.1)
Basophils Relative: 1 %
Eosinophils Absolute: 0.1 10*3/uL (ref 0.0–0.5)
Eosinophils Relative: 2 %
HCT: 43.8 % (ref 39.0–52.0)
Hemoglobin: 14.1 g/dL (ref 13.0–17.0)
Immature Granulocytes: 0 %
Lymphocytes Relative: 39 %
Lymphs Abs: 1.4 10*3/uL (ref 0.7–4.0)
MCH: 29.8 pg (ref 26.0–34.0)
MCHC: 32.2 g/dL (ref 30.0–36.0)
MCV: 92.6 fL (ref 80.0–100.0)
Monocytes Absolute: 0.3 10*3/uL (ref 0.1–1.0)
Monocytes Relative: 7 %
Neutro Abs: 1.7 10*3/uL (ref 1.7–7.7)
Neutrophils Relative %: 51 %
Platelets: 216 10*3/uL (ref 150–400)
RBC: 4.73 MIL/uL (ref 4.22–5.81)
RDW: 13.9 % (ref 11.5–15.5)
WBC: 3.5 10*3/uL — ABNORMAL LOW (ref 4.0–10.5)
nRBC: 0 % (ref 0.0–0.2)

## 2021-10-14 LAB — BASIC METABOLIC PANEL
Anion gap: 14 (ref 5–15)
BUN: 11 mg/dL (ref 6–20)
CO2: 23 mmol/L (ref 22–32)
Calcium: 9 mg/dL (ref 8.9–10.3)
Chloride: 109 mmol/L (ref 98–111)
Creatinine, Ser: 0.67 mg/dL (ref 0.61–1.24)
GFR, Estimated: >60 mL/min (ref >60)
Glucose, Bld: 103 mg/dL — ABNORMAL HIGH (ref 70–99)
Potassium: 3.8 mmol/L (ref 3.5–5.1)
Sodium: 146 mmol/L — ABNORMAL HIGH (ref 135–145)

## 2021-10-14 MED ORDER — OXYCODONE-ACETAMINOPHEN 5-325 MG PO TABS
1.0000 | ORAL_TABLET | ORAL | Status: DC | PRN
  Administered 2021-10-14: 1 via ORAL
  Filled 2021-10-14: qty 1

## 2021-10-14 MED ORDER — NAPROXEN 375 MG PO TABS: 375.0000 mg | ORAL_TABLET | Freq: Two times a day (BID) | ORAL | 0 refills | Status: DC

## 2021-10-14 MED ORDER — PREDNISONE 10 MG (21) PO TBPK: ORAL_TABLET | ORAL | 0 refills | Status: DC

## 2021-10-14 MED ORDER — IBUPROFEN 400 MG PO TABS: 600.0000 mg | ORAL_TABLET | Freq: Once | ORAL | Status: DC

## 2021-10-14 NOTE — ED Notes (Signed)
DC instructions reviewed with pt.  Bus pass provided by SW.  PT DC.
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
## 2021-10-14 NOTE — ED Notes (Signed)
Spoke to [place], [place] regarding order for ibuprofen and pt have 10/10 hip pain.  Verbal order received and pain medication changed to Percocet.

## 2021-10-14 NOTE — ED Notes (Signed)
Patient constantly asking when the doctor will be seeing him as he walked up and down the halls yelling on speaker phone. Patient instructed to please sit and await the MD to come see him. Patient decided to leave and walked out of ED

## 2021-10-14 NOTE — ED Provider Notes (Signed)
Emergency Medicine Provider Triage Evaluation Note  [facility] Abdikadir Fohl , a 50 y.o. male  was evaluated in triage.  Pt complains of right buttock pain of 3-day duration.  Patient reports sometimes he lays and sleeps on the floor at his house.  He denies fever, radiating pain down his right lower extremity, weakness, numbness or tingling in either of his lower extremities.  He denies known injury to this area.  Review of Systems  Positive: Right buttock. Negative: Fever, radiculopathy, paresthesias  Physical Exam  BP (!) 163/128 (BP Location: Left Arm)    Pulse (!) 119    Temp 99.2 F (37.3 C) (Oral)    Resp 16    SpO2 99%  Gen:   Awake, no distress   Resp:  Normal effort  MSK:   Moves extremities without difficulty  Other:  Full range of motion in bilateral hips.  Right hip without tenderness palpation.  Tenderness to palpation present over the right glute.  Medical Decision Making  Medically screening exam initiated at 12:13 PM.  Appropriate orders placed.  [facility] Antuan Limes was informed that the remainder of the evaluation will be completed by another provider, this initial triage assessment does not replace that evaluation, and the importance of remaining in the ED until their evaluation is complete.     Marita Kansas, PA-C 10/14/21 1215    Mancel Bale, MD 10/14/21 [phone]

## 2021-10-14 NOTE — ED Notes (Signed)
Interpreter utilized to communicate with pt in arabic.

## 2021-10-14 NOTE — ED Triage Notes (Signed)
Pt to triage via [facility] from home.  Reports R hip pain x 3-4 days.  No injury.

## 2023-03-22 DIAGNOSIS — Z23 Encounter for immunization: Secondary | ICD-10-CM | POA: Diagnosis not present

## 2023-04-02 DIAGNOSIS — Z23 Encounter for immunization: Secondary | ICD-10-CM | POA: Diagnosis not present

## 2023-05-07 ENCOUNTER — Other Ambulatory Visit: Payer: Self-pay

## 2023-05-07 ENCOUNTER — Emergency Department (HOSPITAL_COMMUNITY): Payer: Medicaid Other

## 2023-05-07 ENCOUNTER — Emergency Department (HOSPITAL_COMMUNITY)
Admission: EM | Admit: 2023-05-07 | Discharge: 2023-05-07 | Disposition: A | Discharge status: Home / Self Care | Payer: Medicaid Other | Attending: Emergency Medicine | Admitting: Emergency Medicine

## 2023-05-07 DIAGNOSIS — S0501XA Injury of conjunctiva and corneal abrasion without foreign body, right eye, initial encounter: Secondary | ICD-10-CM | POA: Diagnosis not present

## 2023-05-07 DIAGNOSIS — Z0489 Encounter for examination and observation for other specified reasons: Secondary | ICD-10-CM | POA: Diagnosis not present

## 2023-05-07 DIAGNOSIS — Z5900 Homelessness unspecified: Secondary | ICD-10-CM | POA: Insufficient documentation

## 2023-05-07 DIAGNOSIS — S0591XA Unspecified injury of right eye and orbit, initial encounter: Secondary | ICD-10-CM | POA: Diagnosis present

## 2023-05-07 DIAGNOSIS — H5710 Ocular pain, unspecified eye: Secondary | ICD-10-CM | POA: Diagnosis not present

## 2023-05-07 DIAGNOSIS — H5711 Ocular pain, right eye: Secondary | ICD-10-CM | POA: Diagnosis not present

## 2023-05-07 MED ORDER — TETRACAINE HCL 0.5 % OP SOLN
2.0000 [drp] | Freq: Once | OPHTHALMIC | Status: AC
  Administered 2023-05-07: 2 [drp] via OPHTHALMIC
  Filled 2023-05-07: qty 4

## 2023-05-07 MED ORDER — ERYTHROMYCIN 5 MG/GM OP OINT: TOPICAL_OINTMENT | OPHTHALMIC | 0 refills | Status: DC

## 2023-05-07 MED ORDER — ERYTHROMYCIN 5 MG/GM OP OINT
TOPICAL_OINTMENT | Freq: Once | OPHTHALMIC | Status: AC
  Administered 2023-05-07: 1 via OPHTHALMIC
  Filled 2023-05-07: qty 3.5

## 2023-05-07 MED ORDER — FLUORESCEIN SODIUM 1 MG OP STRP
1.0000 | ORAL_STRIP | Freq: Once | OPHTHALMIC | Status: AC
  Administered 2023-05-07: 1 via OPHTHALMIC
  Filled 2023-05-07: qty 1
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
## 2023-07-29 ENCOUNTER — Emergency Department (HOSPITAL_COMMUNITY): Payer: Medicaid Other

## 2023-07-29 ENCOUNTER — Encounter (HOSPITAL_COMMUNITY): Payer: Self-pay

## 2023-07-29 ENCOUNTER — Emergency Department (HOSPITAL_COMMUNITY)
Admission: EM | Admit: 2023-07-29 | Discharge: 2023-07-29 | Disposition: A | Discharge status: Home / Self Care | Payer: Medicaid Other | Attending: Emergency Medicine | Admitting: Emergency Medicine

## 2023-07-29 ENCOUNTER — Other Ambulatory Visit: Payer: Self-pay

## 2023-07-29 DIAGNOSIS — S0181XA Laceration without foreign body of other part of head, initial encounter: Secondary | ICD-10-CM

## 2023-07-29 DIAGNOSIS — W19XXXA Unspecified fall, initial encounter: Secondary | ICD-10-CM | POA: Diagnosis not present

## 2023-07-29 DIAGNOSIS — R58 Hemorrhage, not elsewhere classified: Secondary | ICD-10-CM | POA: Diagnosis not present

## 2023-07-29 DIAGNOSIS — S0101XA Laceration without foreign body of scalp, initial encounter: Secondary | ICD-10-CM | POA: Insufficient documentation

## 2023-07-29 DIAGNOSIS — W07XXXA Fall from chair, initial encounter: Secondary | ICD-10-CM | POA: Diagnosis not present

## 2023-07-29 DIAGNOSIS — R220 Localized swelling, mass and lump, head: Secondary | ICD-10-CM | POA: Diagnosis not present

## 2023-07-29 DIAGNOSIS — I10 Essential (primary) hypertension: Secondary | ICD-10-CM | POA: Diagnosis not present

## 2023-07-29 DIAGNOSIS — R519 Headache, unspecified: Secondary | ICD-10-CM | POA: Diagnosis not present

## 2023-07-29 DIAGNOSIS — Z23 Encounter for immunization: Secondary | ICD-10-CM | POA: Insufficient documentation

## 2023-07-29 DIAGNOSIS — S0990XA Unspecified injury of head, initial encounter: Secondary | ICD-10-CM | POA: Diagnosis not present

## 2023-07-29 MED ORDER — TETANUS-DIPHTH-ACELL PERTUSSIS 5-2.5-18.5 LF-MCG/0.5 IM SUSY
0.5000 mL | PREFILLED_SYRINGE | Freq: Once | INTRAMUSCULAR | Status: AC
  Administered 2023-07-29: 0.5 mL via INTRAMUSCULAR
  Filled 2023-07-29: qty 0.5

## 2023-07-29 MED ORDER — LIDOCAINE-EPINEPHRINE-TETRACAINE (LET) TOPICAL GEL
3.0000 mL | Freq: Once | TOPICAL | Status: AC
  Administered 2023-07-29: 3 mL via TOPICAL
  Filled 2023-07-29: qty 3
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
## 2023-07-29 NOTE — ED Notes (Signed)
Let placed on head, has been to ct. Stapler and dermabond at bedside.

## 2023-07-29 NOTE — ED Notes (Addendum)
Pt is walking back and forth to the bathroom in the er and drinking fluids.

## 2023-09-06 ENCOUNTER — Emergency Department (HOSPITAL_COMMUNITY)
Admission: EM | Admit: 2023-09-06 | Discharge: 2023-09-06 | Disposition: A | Discharge status: Home / Self Care | Payer: Medicaid Other | Attending: Emergency Medicine | Admitting: Emergency Medicine

## 2023-09-06 ENCOUNTER — Other Ambulatory Visit: Payer: Self-pay

## 2023-09-06 DIAGNOSIS — Z4802 Encounter for removal of sutures: Secondary | ICD-10-CM | POA: Diagnosis not present

## 2023-09-06 DIAGNOSIS — S0101XD Laceration without foreign body of scalp, subsequent encounter: Secondary | ICD-10-CM | POA: Diagnosis not present

## 2023-09-06 NOTE — ED Triage Notes (Signed)
Patient arrives to have staples removed from head. Denies complaints.

## 2023-09-06 NOTE — ED Provider Notes (Signed)
[place] EMERGENCY DEPARTMENT AT [facility] Provider Note   CSN: [id number] Arrival date & time: 09/06/23  4132     History  Chief Complaint  Patient presents with   Suture / Staple Removal    Edward Mooney is a 52 y.o. male.  HPI    52 year old male comes in with chief complaint of staple removal.  Staples were placed more than a month ago.  States that the area is healing well.  Home Medications Prior to Admission medications   Medication Sig Start Date End Date Taking? Authorizing Provider  atorvastatin (LIPITOR) 20 MG tablet Take 1 tablet (20 mg total) by mouth at bedtime. 09/09/20   Westley Chandler, MD  erythromycin ophthalmic ointment Place a 1/2 inch ribbon of ointment into the lower eyelid. 05/07/23   Glyn Ade, MD  indapamide (LOZOL) 2.5 MG tablet Take 1 tablet (2.5 mg total) by mouth daily. 09/09/20   Westley Chandler, MD  losartan (COZAAR) 25 MG tablet Take 1 tablet (25 mg total) by mouth at bedtime. 09/09/20   Westley Chandler, MD  naproxen (NAPROSYN) 375 MG tablet Take 1 tablet (375 mg total) by mouth 2 (two) times daily with a meal. 10/14/21   Pollyann Savoy, MD  predniSONE (STERAPRED UNI-PAK 21 TAB) 10 MG (21) TBPK tablet 10mg  Tabs, 6 day taper. Use as directed 10/14/21   Pollyann Savoy, MD      Allergies    Patient has no known allergies.    Review of Systems   Review of Systems  Physical Exam Updated Vital Signs BP (!) 172/100 (BP Location: Right Arm)   Pulse 94   Temp 98.1 F (36.7 C) (Oral)   Resp 18   Ht 5\' 11"  (1.803 m)   Wt 67 kg   SpO2 100%   BMI 20.60 kg/m  Physical Exam Vitals and nursing note reviewed.  Constitutional:      Appearance: He is well-developed.  HENT:     Head: Atraumatic.     Comments: 4 staples over the scalp anteriorly noted.  There is a laceration in the adjacent forehead area that has healed well.  No sutures noted. Cardiovascular:     Rate and Rhythm: Normal rate.  Pulmonary:      Effort: Pulmonary effort is normal.  Musculoskeletal:     Cervical back: Neck supple.  Skin:    General: Skin is warm.  Neurological:     Mental Status: He is alert and oriented to person, place, and time.     ED Results / Procedures / Treatments   Labs (all labs ordered are listed, but only abnormal results are displayed) Labs Reviewed - No data to display  EKG None  Radiology No results found.  Procedures .Suture Removal  Date/Time: 09/06/2023 9:09 AM  Performed by: Derwood Kaplan, MD Authorized by: Derwood Kaplan, MD   Consent:    Consent obtained:  Verbal   Consent given by:  Patient   Risks, benefits, and alternatives were discussed: yes     Risks discussed:  Bleeding, wound separation and pain Universal protocol:    Procedure explained and questions answered to patient or proxy's satisfaction: yes     Immediately prior to procedure, a time out was called: yes     Patient identity confirmed:  Arm band Location:    Location:  Head/neck   Head/neck location:  Scalp Procedure details:    Wound appearance:  No signs of infection and indurated  Number of staples removed:  4 Post-procedure details:    Post-removal:  No dressing applied   Procedure completion:  Tolerated well, no immediate complications     Medications Ordered in ED Medications - No data to display  ED Course/ Medical Decision Making/ A&P                                 Medical Decision Making  Edward Mooney comes to the ER for staple removal.  Staples were placed on 9-26.  I have reviewed that note, he had 4 staples placed at that time.  The 4 staples are visible, we will remove them.   Final Clinical Impression(s) / ED Diagnoses Final diagnoses:  Encounter for staple removal    Rx / DC Orders ED Discharge Orders     None         Derwood Kaplan, MD 09/06/23 [phone]

## 2023-09-06 NOTE — ED Notes (Signed)
Patient is supposed to be on B/P ,medication however hasn't taken in 6-7 months. States he doesn't have a steady job.

## 2023-09-06 NOTE — Discharge Instructions (Signed)
Keep the wound clean. Do not pick on the scab. Return to the ER if there is any pus drainage.

## 2023-10-04 ENCOUNTER — Inpatient Hospital Stay (HOSPITAL_COMMUNITY)
Admission: EM | Admit: 2023-10-04 | Discharge: 2023-10-07 | DRG: 141 | Disposition: A | Discharge status: Home / Self Care | Payer: BLUE CROSS/BLUE SHIELD | Attending: Otolaryngology | Admitting: Otolaryngology

## 2023-10-04 ENCOUNTER — Emergency Department (HOSPITAL_COMMUNITY): Payer: BLUE CROSS/BLUE SHIELD

## 2023-10-04 DIAGNOSIS — S02601A Fracture of unspecified part of body of right mandible, initial encounter for closed fracture: Principal | ICD-10-CM | POA: Diagnosis present

## 2023-10-04 DIAGNOSIS — S02609B Fracture of mandible, unspecified, initial encounter for open fracture: Secondary | ICD-10-CM | POA: Diagnosis not present

## 2023-10-04 DIAGNOSIS — S02642A Fracture of ramus of left mandible, initial encounter for closed fracture: Secondary | ICD-10-CM | POA: Diagnosis not present

## 2023-10-04 DIAGNOSIS — F17210 Nicotine dependence, cigarettes, uncomplicated: Secondary | ICD-10-CM | POA: Diagnosis present

## 2023-10-04 DIAGNOSIS — R0602 Shortness of breath: Secondary | ICD-10-CM | POA: Diagnosis not present

## 2023-10-04 DIAGNOSIS — R58 Hemorrhage, not elsewhere classified: Secondary | ICD-10-CM | POA: Diagnosis not present

## 2023-10-04 DIAGNOSIS — S02600B Fracture of unspecified part of body of mandible, initial encounter for open fracture: Secondary | ICD-10-CM | POA: Diagnosis present

## 2023-10-04 DIAGNOSIS — Z5901 Sheltered homelessness: Secondary | ICD-10-CM

## 2023-10-04 DIAGNOSIS — Y9289 Other specified places as the place of occurrence of the external cause: Secondary | ICD-10-CM

## 2023-10-04 DIAGNOSIS — S025XXA Fracture of tooth (traumatic), initial encounter for closed fracture: Secondary | ICD-10-CM | POA: Diagnosis present

## 2023-10-04 DIAGNOSIS — R918 Other nonspecific abnormal finding of lung field: Secondary | ICD-10-CM | POA: Diagnosis not present

## 2023-10-04 DIAGNOSIS — D72829 Elevated white blood cell count, unspecified: Secondary | ICD-10-CM | POA: Diagnosis present

## 2023-10-04 DIAGNOSIS — Z79899 Other long term (current) drug therapy: Secondary | ICD-10-CM

## 2023-10-04 DIAGNOSIS — J439 Emphysema, unspecified: Secondary | ICD-10-CM | POA: Diagnosis not present

## 2023-10-04 DIAGNOSIS — Z23 Encounter for immunization: Secondary | ICD-10-CM

## 2023-10-04 DIAGNOSIS — I10 Essential (primary) hypertension: Secondary | ICD-10-CM | POA: Diagnosis present

## 2023-10-04 DIAGNOSIS — M264 Malocclusion, unspecified: Secondary | ICD-10-CM | POA: Diagnosis present

## 2023-10-04 DIAGNOSIS — K089 Disorder of teeth and supporting structures, unspecified: Secondary | ICD-10-CM | POA: Diagnosis present

## 2023-10-04 DIAGNOSIS — S02641A Fracture of ramus of right mandible, initial encounter for closed fracture: Secondary | ICD-10-CM | POA: Diagnosis not present
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
## 2023-10-05 ENCOUNTER — Encounter (HOSPITAL_COMMUNITY): Payer: Self-pay | Admitting: Emergency Medicine

## 2023-10-05 ENCOUNTER — Emergency Department (HOSPITAL_BASED_OUTPATIENT_CLINIC_OR_DEPARTMENT_OTHER): Payer: BLUE CROSS/BLUE SHIELD | Admitting: Certified Registered Nurse Anesthetist

## 2023-10-05 ENCOUNTER — Other Ambulatory Visit: Payer: Self-pay

## 2023-10-05 ENCOUNTER — Emergency Department (HOSPITAL_COMMUNITY): Payer: BLUE CROSS/BLUE SHIELD

## 2023-10-05 ENCOUNTER — Other Ambulatory Visit (INDEPENDENT_AMBULATORY_CARE_PROVIDER_SITE_OTHER): Payer: Self-pay | Admitting: Otolaryngology

## 2023-10-05 ENCOUNTER — Encounter (HOSPITAL_COMMUNITY)
Admission: EM | Disposition: A | Discharge status: Home / Self Care | Payer: Self-pay | Source: Home / Self Care | Attending: Otolaryngology

## 2023-10-05 ENCOUNTER — Emergency Department (HOSPITAL_COMMUNITY): Payer: BLUE CROSS/BLUE SHIELD | Admitting: Certified Registered Nurse Anesthetist

## 2023-10-05 DIAGNOSIS — S02609B Fracture of mandible, unspecified, initial encounter for open fracture: Secondary | ICD-10-CM | POA: Diagnosis present

## 2023-10-05 DIAGNOSIS — S02601A Fracture of unspecified part of body of right mandible, initial encounter for closed fracture: Secondary | ICD-10-CM

## 2023-10-05 DIAGNOSIS — K089 Disorder of teeth and supporting structures, unspecified: Secondary | ICD-10-CM | POA: Diagnosis not present

## 2023-10-05 DIAGNOSIS — M264 Malocclusion, unspecified: Secondary | ICD-10-CM | POA: Diagnosis not present

## 2023-10-05 DIAGNOSIS — F17210 Nicotine dependence, cigarettes, uncomplicated: Secondary | ICD-10-CM | POA: Diagnosis not present

## 2023-10-05 DIAGNOSIS — S02609A Fracture of mandible, unspecified, initial encounter for closed fracture: Secondary | ICD-10-CM | POA: Diagnosis not present

## 2023-10-05 DIAGNOSIS — Y9289 Other specified places as the place of occurrence of the external cause: Secondary | ICD-10-CM | POA: Diagnosis not present

## 2023-10-05 DIAGNOSIS — S02601B Fracture of unspecified part of body of right mandible, initial encounter for open fracture: Secondary | ICD-10-CM

## 2023-10-05 DIAGNOSIS — Z5901 Sheltered homelessness: Secondary | ICD-10-CM | POA: Diagnosis not present

## 2023-10-05 DIAGNOSIS — S02600B Fracture of unspecified part of body of mandible, initial encounter for open fracture: Secondary | ICD-10-CM | POA: Diagnosis present

## 2023-10-05 DIAGNOSIS — R0602 Shortness of breath: Secondary | ICD-10-CM | POA: Diagnosis not present

## 2023-10-05 DIAGNOSIS — I10 Essential (primary) hypertension: Secondary | ICD-10-CM | POA: Diagnosis not present

## 2023-10-05 DIAGNOSIS — Z23 Encounter for immunization: Secondary | ICD-10-CM | POA: Diagnosis not present

## 2023-10-05 DIAGNOSIS — S025XXA Fracture of tooth (traumatic), initial encounter for closed fracture: Secondary | ICD-10-CM | POA: Diagnosis not present

## 2023-10-05 DIAGNOSIS — Z79899 Other long term (current) drug therapy: Secondary | ICD-10-CM | POA: Diagnosis not present

## 2023-10-05 DIAGNOSIS — S02642A Fracture of ramus of left mandible, initial encounter for closed fracture: Secondary | ICD-10-CM | POA: Diagnosis not present

## 2023-10-05 DIAGNOSIS — D72829 Elevated white blood cell count, unspecified: Secondary | ICD-10-CM | POA: Diagnosis not present

## 2023-10-05 HISTORY — PX: ORIF MANDIBULAR FRACTURE: SHX2127

## 2023-10-05 LAB — BASIC METABOLIC PANEL
Anion gap: 11 (ref 5–15)
BUN: 13 mg/dL (ref 6–20)
CO2: 27 mmol/L (ref 22–32)
Calcium: 8.9 mg/dL (ref 8.9–10.3)
Chloride: 102 mmol/L (ref 98–111)
Creatinine, Ser: 0.76 mg/dL (ref 0.61–1.24)
GFR, Estimated: >60 mL/min (ref >60)
Glucose, Bld: 106 mg/dL — ABNORMAL HIGH (ref 70–99)
Potassium: 3.9 mmol/L (ref 3.5–5.1)
Sodium: 140 mmol/L (ref 135–145)

## 2023-10-05 LAB — CBC WITH DIFFERENTIAL/PLATELET
Abs Immature Granulocytes: 0.04 10*3/uL (ref 0.00–0.07)
Basophils Absolute: 0 10*3/uL (ref 0.0–0.1)
Basophils Relative: 0 %
Eosinophils Absolute: 0 10*3/uL (ref 0.0–0.5)
Eosinophils Relative: 0 %
HCT: 42.4 % (ref 39.0–52.0)
Hemoglobin: 14.2 g/dL (ref 13.0–17.0)
Immature Granulocytes: 0 %
Lymphocytes Relative: 8 %
Lymphs Abs: 0.9 10*3/uL (ref 0.7–4.0)
MCH: 30.1 pg (ref 26.0–34.0)
MCHC: 33.5 g/dL (ref 30.0–36.0)
MCV: 89.8 fL (ref 80.0–100.0)
Monocytes Absolute: 0.5 10*3/uL (ref 0.1–1.0)
Monocytes Relative: 4 %
Neutro Abs: 9.9 10*3/uL — ABNORMAL HIGH (ref 1.7–7.7)
Neutrophils Relative %: 88 %
Platelets: 165 10*3/uL (ref 150–400)
RBC: 4.72 MIL/uL (ref 4.22–5.81)
RDW: 15.2 % (ref 11.5–15.5)
WBC: 11.4 10*3/uL — ABNORMAL HIGH (ref 4.0–10.5)
nRBC: 0 % (ref 0.0–0.2)

## 2023-10-05 LAB — HIV ANTIBODY (ROUTINE TESTING W REFLEX): HIV Screen 4th Generation wRfx: NONREACTIVE

## 2023-10-05 SURGERY — OPEN REDUCTION INTERNAL FIXATION (ORIF) MANDIBULAR FRACTURE
Anesthesia: General

## 2023-10-05 MED ORDER — FENTANYL CITRATE PF 50 MCG/ML IJ SOSY
25.0000 ug | PREFILLED_SYRINGE | Freq: Once | INTRAMUSCULAR | Status: AC
  Administered 2023-10-05: 25 ug via INTRAVENOUS
  Filled 2023-10-05: qty 1

## 2023-10-05 MED ORDER — INDAPAMIDE 1.25 MG PO TABS
2.5000 mg | ORAL_TABLET | Freq: Every day | ORAL | Status: DC
  Filled 2023-10-05: qty 1

## 2023-10-05 MED ORDER — ONDANSETRON HCL 4 MG/2ML IJ SOLN
INTRAMUSCULAR | Status: DC | PRN
  Administered 2023-10-05: 4 mg via INTRAVENOUS

## 2023-10-05 MED ORDER — TRANEXAMIC ACID FOR EPISTAXIS: 500.0000 mg | Freq: Once | TOPICAL | Status: DC

## 2023-10-05 MED ORDER — ATORVASTATIN CALCIUM 10 MG PO TABS
20.0000 mg | ORAL_TABLET | Freq: Every day | ORAL | Status: DC
  Administered 2023-10-05 – 2023-10-06 (×2): 20 mg via ORAL
  Filled 2023-10-05 (×2): qty 2

## 2023-10-05 MED ORDER — DEXMEDETOMIDINE HCL IN NACL 80 MCG/20ML IV SOLN
INTRAVENOUS | Status: DC | PRN
  Administered 2023-10-05: 8 ug via INTRAVENOUS
  Administered 2023-10-05 (×2): 4 ug via INTRAVENOUS

## 2023-10-05 MED ORDER — SUGAMMADEX SODIUM 200 MG/2ML IV SOLN
INTRAVENOUS | Status: DC | PRN
  Administered 2023-10-05: 200 mg via INTRAVENOUS

## 2023-10-05 MED ORDER — OXYMETAZOLINE HCL 0.05 % NA SOLN
NASAL | Status: AC
  Filled 2023-10-05: qty 30

## 2023-10-05 MED ORDER — CEPHALEXIN 250 MG/5ML PO SUSR: 500.0000 mg | Freq: Three times a day (TID) | ORAL | 0 refills | Status: DC

## 2023-10-05 MED ORDER — TRANEXAMIC ACID-NACL 1000-0.7 MG/100ML-% IV SOLN
1000.0000 mg | INTRAVENOUS | Status: AC
  Administered 2023-10-05: 1000 mg via INTRAVENOUS
  Filled 2023-10-05: qty 100

## 2023-10-05 MED ORDER — CHLORHEXIDINE GLUCONATE 0.12 % MT SOLN
OROMUCOSAL | Status: AC
  Administered 2023-10-05: 15 mL via OROMUCOSAL
  Filled 2023-10-05: qty 15

## 2023-10-05 MED ORDER — LACTATED RINGERS IV SOLN: INTRAVENOUS | Status: DC

## 2023-10-05 MED ORDER — CHLORHEXIDINE GLUCONATE 0.12 % MT SOLN: 15.0000 mL | Freq: Once | OROMUCOSAL | Status: AC

## 2023-10-05 MED ORDER — MIDAZOLAM HCL 2 MG/2ML IJ SOLN
INTRAMUSCULAR | Status: DC | PRN
  Administered 2023-10-05: 2 mg via INTRAVENOUS

## 2023-10-05 MED ORDER — FENTANYL CITRATE (PF) 250 MCG/5ML IJ SOLN
INTRAMUSCULAR | Status: DC | PRN
  Administered 2023-10-05 (×2): 100 ug via INTRAVENOUS

## 2023-10-05 MED ORDER — ORAL CARE MOUTH RINSE: 15.0000 mL | Freq: Once | OROMUCOSAL | Status: AC

## 2023-10-05 MED ORDER — ROCURONIUM BROMIDE 10 MG/ML (PF) SYRINGE
PREFILLED_SYRINGE | INTRAVENOUS | Status: DC | PRN
  Administered 2023-10-05: 10 mg via INTRAVENOUS
  Administered 2023-10-05: 70 mg via INTRAVENOUS

## 2023-10-05 MED ORDER — MIDAZOLAM HCL 2 MG/2ML IJ SOLN
INTRAMUSCULAR | Status: AC
  Filled 2023-10-05: qty 2

## 2023-10-05 MED ORDER — LIDOCAINE-EPINEPHRINE 1 %-1:100000 IJ SOLN
INTRAMUSCULAR | Status: DC | PRN
  Administered 2023-10-05: 10 mL
  Administered 2023-10-05: 6 mL

## 2023-10-05 MED ORDER — PROPOFOL 10 MG/ML IV BOLUS
INTRAVENOUS | Status: AC
  Filled 2023-10-05: qty 20

## 2023-10-05 MED ORDER — HYDROCODONE-ACETAMINOPHEN 7.5-325 MG/15ML PO SOLN: 10.0000 mL | Freq: Four times a day (QID) | ORAL | 0 refills | Status: DC | PRN

## 2023-10-05 MED ORDER — SODIUM CHLORIDE 0.9 % IV SOLN
3.0000 g | Freq: Once | INTRAVENOUS | Status: AC
  Administered 2023-10-05: 3 g via INTRAVENOUS
  Filled 2023-10-05: qty 8

## 2023-10-05 MED ORDER — LOSARTAN POTASSIUM 50 MG PO TABS
25.0000 mg | ORAL_TABLET | Freq: Every day | ORAL | Status: DC
  Administered 2023-10-05 – 2023-10-06 (×2): 25 mg via ORAL
  Filled 2023-10-05 (×2): qty 1

## 2023-10-05 MED ORDER — PHENYLEPHRINE HCL-NACL 20-0.9 MG/250ML-% IV SOLN
INTRAVENOUS | Status: DC | PRN
  Administered 2023-10-05: 75 ug/min via INTRAVENOUS

## 2023-10-05 MED ORDER — EPINEPHRINE HCL (NASAL) 0.1 % NA SOLN
NASAL | Status: AC
  Filled 2023-10-05: qty 30

## 2023-10-05 MED ORDER — 0.9 % SODIUM CHLORIDE (POUR BTL) OPTIME
TOPICAL | Status: DC | PRN
  Administered 2023-10-05: 1000 mL

## 2023-10-05 MED ORDER — CEFAZOLIN SODIUM-DEXTROSE 1-4 GM/50ML-% IV SOLN
1.0000 g | Freq: Three times a day (TID) | INTRAVENOUS | Status: AC
  Administered 2023-10-05 – 2023-10-06 (×3): 1 g via INTRAVENOUS
  Filled 2023-10-05 (×3): qty 50

## 2023-10-05 MED ORDER — LIDOCAINE-EPINEPHRINE 1 %-1:100000 IJ SOLN
INTRAMUSCULAR | Status: AC
  Filled 2023-10-05: qty 1

## 2023-10-05 MED ORDER — LACTATED RINGERS IV SOLN: INTRAVENOUS | Status: DC | PRN

## 2023-10-05 MED ORDER — FENTANYL CITRATE (PF) 250 MCG/5ML IJ SOLN
INTRAMUSCULAR | Status: AC
  Filled 2023-10-05: qty 5

## 2023-10-05 MED ORDER — DEXAMETHASONE SODIUM PHOSPHATE 10 MG/ML IJ SOLN
INTRAMUSCULAR | Status: DC | PRN
  Administered 2023-10-05: 10 mg via INTRAVENOUS

## 2023-10-05 MED ORDER — EPHEDRINE SULFATE-NACL 50-0.9 MG/10ML-% IV SOSY
PREFILLED_SYRINGE | INTRAVENOUS | Status: DC | PRN
  Administered 2023-10-05: 10 mg via INTRAVENOUS

## 2023-10-05 MED ORDER — MORPHINE SULFATE (PF) 2 MG/ML IV SOLN
2.0000 mg | INTRAVENOUS | Status: DC | PRN
  Administered 2023-10-05 – 2023-10-07 (×7): 2 mg via INTRAVENOUS
  Filled 2023-10-05 (×7): qty 1

## 2023-10-05 MED ORDER — PROPOFOL 10 MG/ML IV BOLUS
INTRAVENOUS | Status: DC | PRN
  Administered 2023-10-05: 200 mg via INTRAVENOUS

## 2023-10-05 MED ORDER — TETANUS-DIPHTH-ACELL PERTUSSIS 5-2.5-18.5 LF-MCG/0.5 IM SUSY
0.5000 mL | PREFILLED_SYRINGE | Freq: Once | INTRAMUSCULAR | Status: AC
  Administered 2023-10-05: 0.5 mL via INTRAMUSCULAR
  Filled 2023-10-05: qty 0.5

## 2023-10-05 MED ORDER — PHENYLEPHRINE 80 MCG/ML (10ML) SYRINGE FOR IV PUSH (FOR BLOOD PRESSURE SUPPORT)
PREFILLED_SYRINGE | INTRAVENOUS | Status: DC | PRN
  Administered 2023-10-05: 160 ug via INTRAVENOUS
  Administered 2023-10-05: 80 ug via INTRAVENOUS
  Administered 2023-10-05 (×3): 160 ug via INTRAVENOUS
  Administered 2023-10-05: 80 ug via INTRAVENOUS

## 2023-10-05 MED ORDER — INDAPAMIDE 1.25 MG PO TABS
2.5000 mg | ORAL_TABLET | Freq: Every day | ORAL | Status: DC
  Administered 2023-10-05 – 2023-10-07 (×3): 2.5 mg via ORAL
  Filled 2023-10-05 (×3): qty 2

## 2023-10-05 SURGICAL SUPPLY — 48 items
BAG COUNTER SPONGE SURGICOUNT (BAG) ×1 IMPLANT
BAR FIX PREFORMED OMNIMAX (Miscellaneous) IMPLANT
BIT DRILL DEPTH MARK1.8X115 26 (MISCELLANEOUS) IMPLANT
BLADE SURG 10 STRL SS (BLADE) IMPLANT
BLADE SURG 15 STRL LF DISP TIS (BLADE) IMPLANT
CANISTER SUCT 3000ML PPV (MISCELLANEOUS) ×1 IMPLANT
CLEANER TIP ELECTROSURG 2X2 (MISCELLANEOUS) ×1 IMPLANT
CONFORMERS SILICONE  MED (Ophthalmic Related) IMPLANT
COVER SURGICAL LIGHT HANDLE (MISCELLANEOUS) ×2 IMPLANT
DRAPE HALF SHEET 40X57 (DRAPES) IMPLANT
DRILL DEPTH MARK1.8X115 26 (MISCELLANEOUS) ×1 IMPLANT
DRIVER SURG ZDRIVE HIGH TORQUE (ORTHOPEDIC DISPOSABLE SUPPLIES) IMPLANT
ELECT COATED BLADE 2.86 ST (ELECTRODE) IMPLANT
ELECT NDL BLADE 2-5/6 (NEEDLE) IMPLANT
ELECT NEEDLE BLADE 2-5/6 (NEEDLE) IMPLANT
ELECT REM PT RETURN 9FT ADLT (ELECTROSURGICAL) ×1 IMPLANT
ELECTRODE REM PT RTRN 9FT ADLT (ELECTROSURGICAL) ×1 IMPLANT
GLOVE ECLIPSE 7.5 STRL STRAW (GLOVE) ×1 IMPLANT
GOWN STRL REUS W/ TWL LRG LVL3 (GOWN DISPOSABLE) ×2 IMPLANT
KIT BASIN OR (CUSTOM PROCEDURE TRAY) ×1 IMPLANT
KIT TURNOVER KIT B (KITS) ×1 IMPLANT
NDL HYPO 25GX1X1/2 BEV (NEEDLE) IMPLANT
NEEDLE HYPO 25GX1X1/2 BEV (NEEDLE) IMPLANT
NS IRRIG 1000ML POUR BTL (IV SOLUTION) ×1 IMPLANT
PAD ARMBOARD 7.5X6 YLW CONV (MISCELLANEOUS) ×2 IMPLANT
PATTIES SURGICAL .5 X3 (DISPOSABLE) IMPLANT
PENCIL FOOT CONTROL (ELECTRODE) ×1 IMPLANT
PLATE LOCK 4H STRT 1.6 GOLD (Plate) IMPLANT
PLATE LOCK STRT 4H 1/SCREW (Plate) IMPLANT
POSITIONER HEAD DONUT 9IN (MISCELLANEOUS) IMPLANT
PROTECTOR CORNEAL (OPHTHALMIC RELATED) IMPLANT
SCISSORS WIRE ANG 4 3/4 DISP (INSTRUMENTS) IMPLANT
SCREW BONE 2X7 CROSS DRIVE (Screw) IMPLANT
SCREW BONE MANDIB SD 2X9 (Screw) IMPLANT
SCREW NLOCK LORENZ 2X5 (Screw) IMPLANT
SCREW NON LOCK X-DR 2.3X12 (Screw) IMPLANT
SCREW X-DR EMERG 2.7X12 (Screw) IMPLANT
SPIKE FLUID TRANSFER (MISCELLANEOUS) ×1 IMPLANT
SUT CHROMIC 3 0 PS 2 (SUTURE) ×1 IMPLANT
SUT MNCRL AB 4-0 PS2 18 (SUTURE) ×1 IMPLANT
SUT NYLON ETHILON 5-0 P-3 1X18 (SUTURE) ×1 IMPLANT
SUT SILK 2 0 PERMA HAND 18 BK (SUTURE) IMPLANT
SUT STEEL 0 18XMFL TIE 17 (SUTURE) IMPLANT
SUT STEEL 4 (SUTURE) ×1 IMPLANT
TOWEL GREEN STERILE FF (TOWEL DISPOSABLE) ×1 IMPLANT
TRAY ENT MC OR (CUSTOM PROCEDURE TRAY) ×1 IMPLANT
WATER STERILE IRR 1000ML POUR (IV SOLUTION) ×1 IMPLANT
WIRE 24 GAUGE OMINIMAX MMF (WIRE) IMPLANT

## 2023-10-05 NOTE — ED Notes (Signed)
Report received. Pt moving from room 33 to HW22. Posted for surgery at 0845 for bilateral mandibular jaw surgery, pending OR ready. Consent signed and with Dr. Jearld Fenton.

## 2023-10-05 NOTE — Anesthesia Preprocedure Evaluation (Signed)
Anesthesia Evaluation  Patient identified by MRN, date of birth, ID band Patient awake    Reviewed: Allergy & Precautions, NPO status , Patient's Chart, lab work & pertinent test results, reviewed documented beta blocker date and time   History of Anesthesia Complications Negative for: history of anesthetic complications  Airway Mallampati: IV  TM Distance: >3 FB   Mouth opening: Limited Mouth Opening  Dental  (+) Poor Dentition   Pulmonary neg COPD, Current Smoker and Patient abstained from smoking.   breath sounds clear to auscultation       Cardiovascular hypertension, (-) angina (-) CAD, (-) Past MI, (-) Cardiac Stents, (-) CABG and (-) CHF  Rhythm:Regular Rate:Normal     Neuro/Psych neg Seizures    GI/Hepatic ,neg GERD  ,,(+) neg Cirrhosis        Endo/Other    Renal/GU      Musculoskeletal   Abdominal   Peds  Hematology   Anesthesia Other Findings   Reproductive/Obstetrics                              Anesthesia Physical Anesthesia Plan  ASA: 2  Anesthesia Plan: General   Post-op Pain Management:    Induction: Intravenous  PONV Risk Score and Plan: 2 and Ondansetron  Airway Management Planned: Nasal ETT and Video Laryngoscope Planned  Additional Equipment:   Intra-op Plan:   Post-operative Plan: Extubation in OR  Informed Consent: I have reviewed the patients History and Physical, chart, labs and discussed the procedure including the risks, benefits and alternatives for the proposed anesthesia with the patient or authorized representative who has indicated his/her understanding and acceptance.     Dental advisory given and Interpreter used for interview  Plan Discussed with: CRNA  Anesthesia Plan Comments:          Anesthesia Quick Evaluation
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
## 2023-10-05 NOTE — ED Notes (Signed)
Resting/ sleeping, NAD, calm.  

## 2023-10-05 NOTE — Anesthesia Postprocedure Evaluation (Signed)
Anesthesia Post Note  Patient: Edward Mooney  Procedure(s) Performed: OPEN REDUCTION INTERNAL FIXATION (ORIF) MANDIBULAR FRACTURE     Patient location during evaluation: PACU Anesthesia Type: General Level of consciousness: awake Pain management: pain level controlled Vital Signs Assessment: post-procedure vital signs reviewed and stable Respiratory status: spontaneous breathing, nonlabored ventilation, respiratory function stable and patient connected to nasal cannula oxygen Cardiovascular status: blood pressure returned to baseline and stable Postop Assessment: no apparent nausea or vomiting Anesthetic complications: yes   Encounter Notable Events  Notable Event Outcome Phase Comment  Difficult to intubate - expected  Intraprocedure Filed from anesthesia note documentation.    Last Vitals:  Vitals:   10/05/23 1315 10/05/23 1330  BP: (!) 150/99 (!) 149/95  Pulse: (!) 102 (!) 101  Resp: 12 17  Temp: 37.3 C   SpO2: 97% 94%    Last Pain:  Vitals:   10/05/23 1315  TempSrc:   PainSc: 0-No pain                 Mariann Barter

## 2023-10-05 NOTE — ED Provider Notes (Signed)
Patient transferred from outside hospital to see ENT Dr. Jearld Fenton.  Patient has bilateral mandibular fractures.  Vital signs and airway stable.  Discussed with Dr. Jearld Fenton that patient has arrived to the ED  BP (!) 149/89 (BP Location: Right Arm)   Pulse (!) 111   Temp 97.9 F (36.6 C) (Oral)   Resp 18   SpO2 99%     Edward Octave, MD 10/05/23 [phone]
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
## 2023-10-05 NOTE — ED Notes (Signed)
Report attempted to OR, declined, not ready.

## 2023-10-05 NOTE — Transfer of Care (Signed)
Immediate Anesthesia Transfer of Care Note  Patient: Salome Holmes Alghayeb Bennett  Procedure(s) Performed: OPEN REDUCTION INTERNAL FIXATION (ORIF) MANDIBULAR FRACTURE  Patient Location: PACU  Anesthesia Type:General  Level of Consciousness: awake, alert , and oriented  Airway & Oxygen Therapy: Patient Spontanous Breathing  Post-op Assessment: Post -op Vital signs reviewed and stable  Post vital signs: Reviewed and stable  Last Vitals:  Vitals Value Taken Time  BP 150/99 10/05/23 1315  Temp 37.3 C 10/05/23 1315  Pulse 109 10/05/23 1318  Resp 18 10/05/23 1318  SpO2 96 % 10/05/23 1318  Vitals shown include unfiled device data.  Last Pain:  Vitals:   10/05/23 1315  TempSrc:   PainSc: 0-No pain         Complications:  Encounter Notable Events  Notable Event Outcome Phase Comment  Difficult to intubate - expected  Intraprocedure Filed from anesthesia note documentation.

## 2023-10-05 NOTE — Anesthesia Procedure Notes (Signed)
Procedure Name: Intubation Date/Time: 10/05/2023 12:23 PM  Performed by: Sandie Ano, CRNAPre-anesthesia Checklist: Patient identified, Emergency Drugs available, Suction available and Patient being monitored Patient Re-evaluated:Patient Re-evaluated prior to induction Oxygen Delivery Method: Circle System Utilized Preoxygenation: Pre-oxygenation with 100% oxygen Induction Type: IV induction Ventilation: Mask ventilation without difficulty Laryngoscope Size: Mac, 4 and Glidescope Grade View: Grade IV Nasal Tubes: Nasal Rae, Nasal prep performed and Right Tube size: 7.0 mm Number of attempts: 1 Airway Equipment and Method: Stylet and Oral airway Placement Confirmation: ETT inserted through vocal cords under direct vision, positive ETCO2 and breath sounds checked- equal and bilateral Secured at: 25 cm Tube secured with: Tape Dental Injury: Teeth and Oropharynx as per pre-operative assessment  Difficulty Due To: Difficulty was anticipated, Difficult Airway- due to reduced neck mobility, Difficult Airway- due to anterior larynx and Difficult Airway- due to immobile epiglottis Future Recommendations: Recommend- induction with short-acting agent, and alternative techniques readily available
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
## 2023-10-05 NOTE — ED Notes (Signed)
Pt requesting pain medication, secure chat sent to MD requesting pain medication.
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
## 2023-10-05 NOTE — ED Notes (Signed)
This paramedic attempted to call report to OR but they were unable to accept it as they do not know when he will be coming up

## 2023-10-06 ENCOUNTER — Encounter (HOSPITAL_COMMUNITY): Payer: Self-pay | Admitting: Otolaryngology

## 2023-10-06 DIAGNOSIS — Z5901 Sheltered homelessness: Secondary | ICD-10-CM | POA: Diagnosis not present

## 2023-10-06 DIAGNOSIS — I10 Essential (primary) hypertension: Secondary | ICD-10-CM | POA: Diagnosis present

## 2023-10-06 DIAGNOSIS — F17210 Nicotine dependence, cigarettes, uncomplicated: Secondary | ICD-10-CM | POA: Diagnosis present

## 2023-10-06 DIAGNOSIS — S02609B Fracture of mandible, unspecified, initial encounter for open fracture: Secondary | ICD-10-CM | POA: Diagnosis present

## 2023-10-06 DIAGNOSIS — K089 Disorder of teeth and supporting structures, unspecified: Secondary | ICD-10-CM | POA: Diagnosis present

## 2023-10-06 DIAGNOSIS — S02601A Fracture of unspecified part of body of right mandible, initial encounter for closed fracture: Secondary | ICD-10-CM | POA: Diagnosis present

## 2023-10-06 DIAGNOSIS — Z23 Encounter for immunization: Secondary | ICD-10-CM | POA: Diagnosis not present

## 2023-10-06 DIAGNOSIS — M264 Malocclusion, unspecified: Secondary | ICD-10-CM | POA: Diagnosis present

## 2023-10-06 DIAGNOSIS — Y9289 Other specified places as the place of occurrence of the external cause: Secondary | ICD-10-CM | POA: Diagnosis not present

## 2023-10-06 DIAGNOSIS — S02642A Fracture of ramus of left mandible, initial encounter for closed fracture: Secondary | ICD-10-CM | POA: Diagnosis present

## 2023-10-06 DIAGNOSIS — Z79899 Other long term (current) drug therapy: Secondary | ICD-10-CM | POA: Diagnosis not present

## 2023-10-06 DIAGNOSIS — D72829 Elevated white blood cell count, unspecified: Secondary | ICD-10-CM | POA: Diagnosis present

## 2023-10-06 DIAGNOSIS — S025XXA Fracture of tooth (traumatic), initial encounter for closed fracture: Secondary | ICD-10-CM | POA: Diagnosis present

## 2023-10-06 MED ORDER — PROPOFOL 10 MG/ML IV BOLUS
INTRAVENOUS | Status: AC
  Filled 2023-10-06: qty 20

## 2023-10-06 NOTE — Plan of Care (Signed)

## 2023-10-06 NOTE — Plan of Care (Signed)
  Problem: Education: Goal: Knowledge of General Education information will improve Description: Including pain rating scale, medication(s)/side effects and non-pharmacologic comfort measures 10/06/2023 0334 by Lorain Childes, Jamal Collin, RN Outcome: Progressing 10/06/2023 0334 by Lorain Childes, Jamal Collin, RN Outcome: Progressing   Problem: Clinical Measurements: Goal: Respiratory complications will improve 10/06/2023 0334 by Meredith Pel, RN Outcome: Progressing 10/06/2023 0334 by Meredith Pel, RN Outcome: Progressing Goal: Cardiovascular complication will be avoided 10/06/2023 0334 by Meredith Pel, RN Outcome: Progressing 10/06/2023 0334 by Meredith Pel, RN Outcome: Progressing   Problem: Activity: Goal: Risk for activity intolerance will decrease 10/06/2023 0334 by Lorain Childes, Jamal Collin, RN Outcome: Progressing 10/06/2023 0334 by Lorain Childes, Jamal Collin, RN Outcome: Progressing   Problem: Nutrition: Goal: Adequate nutrition will be maintained Outcome: Progressing   Problem: Safety: Goal: Ability to remain free from injury will improve 10/06/2023 0334 by Meredith Pel, RN Outcome: Progressing 10/06/2023 0334 by Meredith Pel, RN Outcome: Progressing   Problem: Skin Integrity: Goal: Risk for impaired skin integrity will decrease 10/06/2023 0334 by Meredith Pel, RN Outcome: Progressing 10/06/2023 0334 by Meredith Pel, RN Outcome: Progressing

## 2023-10-06 NOTE — Progress Notes (Signed)
Patient ID: Edward Mooney, male   DOB: [birth year], 52 y.o.   MRN: [id number]  He is having pain. He was told that is expected at ths point. He is living in a shelter right now so he doesn't want to go home today. He feels like his teeth meet normally.   Full liquid diet. Social worker consult. Continue care
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
## 2023-10-06 NOTE — H&P (View-Only) (Signed)
 Patient ID: Edward Mooney, male   DOB: [birth year], 52 y.o.   MRN: [id number]  He is having pain. He was told that is expected at ths point. He is living in a shelter right now so he doesn't want to go home today. He feels like his teeth meet normally.   Full liquid diet. Social worker consult. Continue care

## 2023-10-07 ENCOUNTER — Telehealth (INDEPENDENT_AMBULATORY_CARE_PROVIDER_SITE_OTHER): Payer: Self-pay | Admitting: Otolaryngology

## 2023-10-07 ENCOUNTER — Other Ambulatory Visit (HOSPITAL_COMMUNITY): Payer: Self-pay

## 2023-10-07 MED ORDER — IBUPROFEN 100 MG/5ML PO SUSP
400.0000 mg | Freq: Three times a day (TID) | ORAL | Status: DC | PRN
  Administered 2023-10-07: 400 mg via ORAL
  Filled 2023-10-07 (×2): qty 20

## 2023-10-07 MED ORDER — HYDROCODONE-ACETAMINOPHEN 7.5-325 MG/15ML PO SOLN
10.0000 mL | Freq: Four times a day (QID) | ORAL | 0 refills | Status: AC | PRN
  Filled 2023-10-07 (×2): qty 120, 3d supply, fill #0

## 2023-10-07 MED ORDER — CEPHALEXIN 250 MG/5ML PO SUSR
500.0000 mg | Freq: Three times a day (TID) | ORAL | 0 refills | Status: DC
  Filled 2023-10-07: qty 300, 10d supply, fill #0

## 2023-10-07 NOTE — Plan of Care (Signed)
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
## 2023-10-15 ENCOUNTER — Other Ambulatory Visit (HOSPITAL_COMMUNITY): Payer: Self-pay

## 2023-10-15 ENCOUNTER — Other Ambulatory Visit: Payer: Self-pay

## 2023-10-15 ENCOUNTER — Emergency Department (HOSPITAL_COMMUNITY)
Admission: EM | Admit: 2023-10-15 | Discharge: 2023-10-15 | Disposition: A | Discharge status: Home / Self Care | Payer: Medicaid Other | Attending: Emergency Medicine | Admitting: Emergency Medicine

## 2023-10-15 ENCOUNTER — Other Ambulatory Visit (INDEPENDENT_AMBULATORY_CARE_PROVIDER_SITE_OTHER): Payer: Self-pay | Admitting: Otolaryngology

## 2023-10-15 DIAGNOSIS — K0889 Other specified disorders of teeth and supporting structures: Secondary | ICD-10-CM | POA: Diagnosis not present

## 2023-10-15 DIAGNOSIS — Z79899 Other long term (current) drug therapy: Secondary | ICD-10-CM | POA: Diagnosis not present

## 2023-10-15 DIAGNOSIS — I10 Essential (primary) hypertension: Secondary | ICD-10-CM | POA: Insufficient documentation

## 2023-10-15 MED ORDER — CEPHALEXIN 250 MG/5ML PO SUSR
500.0000 mg | Freq: Three times a day (TID) | ORAL | 0 refills | Status: DC
  Filled 2023-10-15: qty 300, 10d supply, fill #0

## 2023-10-15 MED ORDER — AMLODIPINE BESYLATE 5 MG PO TABS
5.0000 mg | ORAL_TABLET | Freq: Once | ORAL | Status: AC
  Administered 2023-10-15: 5 mg via ORAL
  Filled 2023-10-15: qty 1

## 2023-10-15 MED ORDER — HYDROCODONE-ACETAMINOPHEN 5-325 MG PO TABS
1.0000 | ORAL_TABLET | Freq: Once | ORAL | Status: AC
Start: 2023-10-15 — End: 2023-10-15
  Administered 2023-10-15: 1 via ORAL
  Filled 2023-10-15: qty 1

## 2023-10-15 MED ORDER — AMLODIPINE BESYLATE 5 MG PO TABS: 5.0000 mg | ORAL_TABLET | Freq: Every day | ORAL | 0 refills | Status: DC

## 2023-10-15 NOTE — Progress Notes (Addendum)
Patient ID: Edward Mooney, male   DOB: April 15, 1971, 52 y.o.   MRN: [id number]   He is here 10 days after surgery and he has cut his wires. He is feeling like the molar is moving. He is not having much pain in the molar area just where the arch bars are eveywhere.   The molar is clearly moving but I think it is the fracture not the tooth that is moving.   He needs an oral surgeon to assist with the molar. I will need to find someone that can help. For now patient needs keflex and some pain meds and try to figure this out quickly with oral surgery. He needs to come into ER for checkin at 6 AM tomorrow for surgery. NPO after midmight

## 2023-10-15 NOTE — ED Triage Notes (Signed)
Pt. Stated, I have all kinds of bad things with my teeth, no good.

## 2023-10-15 NOTE — ED Provider Notes (Cosign Needed Addendum)
[facility] AT [facility] Provider Note   CSN: [id number] Arrival date & time: 10/15/23  4098     History  Chief Complaint  Patient presents with   Dental Pain   HPI Edward Mooney is a 52 y.o. male with recent history of mandible fracture status post repair on December 3 presenting for dental pain.  States the pain is all over his mouth and has been constant since the surgery.  States he ended up "cutting the wires" in his mouth because the pain was intolerable.  Denies fever or chills.  Denies any trouble swallowing.  Denies drooling.  Patient requesting that the hardware in his mouth be removed today.  Also reports that he does not take anything for high blood pressure.  Denies chest pain shortness of breath and headache at this time.   Dental Pain      Home Medications Prior to Admission medications   Medication Sig Start Date End Date Taking? Authorizing Provider  amLODipine (NORVASC) 5 MG tablet Take 1 tablet (5 mg total) by mouth daily. 10/15/23  Yes Gareth Eagle, PA-C  cephALEXin (KEFLEX) 250 MG/5ML suspension Take 10 mLs (500 mg total) by mouth 3 (three) times daily for 10 days. 10/15/23 10/25/23  Suzanna Obey, MD      Allergies    Patient has no known allergies.    Review of Systems   See HPI  Physical Exam Updated Vital Signs BP (!) 179/115 (BP Location: Right Arm)   Pulse 94   Temp 98.5 F (36.9 C) (Oral)   Resp 17   SpO2 92%  Physical Exam Constitutional:      Appearance: Normal appearance.  HENT:     Head: Normocephalic.     Nose: Nose normal.     Mouth/Throat:     Dentition: Dental tenderness and gingival swelling present. No dental abscesses.     Comments: Surgical hardware noted in the inferior and superior gingiva.  Pain with palpation of the inferior anterior teeth.  Some generalized gingival swelling Eyes:     Conjunctiva/sclera: Conjunctivae normal.  Pulmonary:     Effort: Pulmonary effort is  normal.  Neurological:     Mental Status: He is alert.  Psychiatric:        Mood and Affect: Mood normal.     ED Results / Procedures / Treatments   Labs (all labs ordered are listed, but only abnormal results are displayed) Labs Reviewed - No data to display  EKG None  Radiology No results found.  Procedures Procedures    Medications Ordered in ED Medications  HYDROcodone-acetaminophen (NORCO/VICODIN) 5-325 MG per tablet 1 tablet (1 tablet Oral Given 10/15/23 0913)  amLODipine (NORVASC) tablet 5 mg (5 mg Oral Given 10/15/23 1135)    ED Course/ Medical Decision Making/ A&P Clinical Course as of 10/15/23 1155  Fri Oct 15, 2023  1191 Discussed patient with Dr. Jearld Fenton of ENT.  Dr. Jearld Fenton states that he will come by and evaluate the patient in person and states that he will likely need to be required. [JR]    Clinical Course User Index [JR] Gareth Eagle, PA-C                                 Medical Decision Making Risk Prescription drug management.   52 year old well-appearing male presenting for dental pain status post mandible fracture repair 10 days ago. Exam notable  for surgical hardware in the inferior and superior gingiva, generalized gingival edema and pain with palpation to the inferior anterior teeth.  DDx includes postop infection, deep space abscess, dental infection, Ludwick's angina, other. Discussed patient with Dr. Jearld Fenton of ENT who evaluated patient in the ED. Advising surgical repair. Dr. Jearld Fenton also mention that he has arranged with oral surgery and potentially will plan for repair next Wednesday morning (12/18).  As Dr. Jearld Fenton recommended, I discussed with patient to arrive at 6 AM on Wednesday morning, n.p.o. at midnight and to return earlier if symptoms worsen.  Started him on amlodipine and advised him to follow-up with his PCP.  On reassessment patient remains clinically well-appearing, no acute distress and hemodynamically stable.  Blood pressure has  improved without intervention.  Discharged in good condition.    Final Clinical Impression(s) / ED Diagnoses Final diagnoses:  Uncontrolled hypertension  Pain, dental    Rx / DC Orders ED Discharge Orders          Ordered    amLODipine (NORVASC) 5 MG tablet  Daily        10/15/23 1022             Gareth Eagle, [place] 10/15/23 1155    Horton, Clabe Seal, DO 10/17/23 1342

## 2023-10-15 NOTE — ED Notes (Signed)
Patient is aware he is to go home and return in the am for outpatient surgery per Dr Jearld Fenton

## 2023-10-15 NOTE — Discharge Instructions (Addendum)
Evaluation today was overall reassuring.  Dr. Jearld Fenton has requested that he return to the ED on Wednesday (10/20/2023) at 6 AM.  Please no eating or drinking after midnight the night before in preparation for surgery.  If your symptoms worsen anyway please return to the ED further evaluation.  HYPERTENSION: Thank you for the opportunity to take care of you in our Emergency Department. You have been diagnosed with high blood pressure, also known as hypertension. This means that the force of blood against the walls of your blood vessels called is too strong. It also means that your heart has to work harder to move the blood. High blood pressure usually has no symptoms, but over time, it can cause serious health problems such as Heart attack and heart failure Stroke Kidney disease and failure Vision loss With the help from your healthcare provider and some important life style changes, you can manage your blood pressure and protect your health. Please read the instructions provided on hypertension, how to manage it and how to check your blood pressure. Additionally, use the blood pressure log provided to record your blood pressures. Take the blood pressure log with you to your primary care doctor so that they can adjust your blood pressure medications if needed. Please read the instructions on follow-up appointment. Return to the ER or Call 911 right away if you have any of these symptoms: Chest pain or shortness of breath Severe headache Weakness, tingling, or numbness of your face, arms, or legs (especially on 1 side of the body) Sudden change in vision Confusion, trouble speaking, or trouble understanding speech

## 2023-10-16 ENCOUNTER — Encounter (HOSPITAL_COMMUNITY)
Admission: RE | Disposition: A | Discharge status: Home / Self Care | Payer: Self-pay | Source: Home / Self Care | Attending: Otolaryngology

## 2023-10-16 SURGERY — OPEN REDUCTION INTERNAL FIXATION (ORIF) MANDIBULAR FRACTURE
Anesthesia: General

## 2023-10-19 NOTE — Progress Notes (Signed)
[facility] CALL  Unable to reach patient by phone. Instructions left via [facility] interpreter [phone].  PCP -  Cardiologist -   PPM/ICD -  Device Orders -  Rep Notified -   Chest x-ray - 1 view-10/05/23 EKG - 10/15/23 Stress Test - ECHO -  Cardiac Cath -   Sleep Study -  CPAP -   Fasting Blood Sugar -  Checks Blood Sugar _____ times a day  Blood Thinner Instructions: Aspirin Instructions:  ERAS Protcol -no PRE-SURGERY Ensure or G2-   COVID TEST-    Anesthesia review:      Surgical Instructions    Your procedure is scheduled on December 18  Report to [facility] Main Entrance "A" at 0730 A.M., then check in with the Admitting office.  Call this number if you have problems the morning of surgery:  [phone]    Remember:  Do not eat or drink anything after midnight the night before your surgery.   Take these medicines the morning of surgery with A SIP OF WATER: Norvasc,Keflex     [facility] is not responsible for any belongings or valuables. .   Do NOT Smoke (Tobacco/Vaping)  24 hours prior to your procedure     Contacts, glasses, hearing aids, dentures or partials may not be worn into surgery, please bring cases for these belongings   Patients discharged the day of surgery will not be allowed to drive home, and someone needs to stay with them for 24 hours.    Special instructions:    Oral Hygiene is also important to reduce your risk of infection.  Remember - BRUSH YOUR TEETH THE MORNING OF SURGERY WITH YOUR REGULAR TOOTHPASTE   Day of Surgery:  Take a shower the day of or night before with antibacterial soap. Wear Clean/Comfortable clothing the morning of surgery Do not apply any deodorants/lotions.   Do not wear jewelry or makeup Do not wear lotions, powders, perfumes/colognes, or deodorant. Do not shave 48 hours prior to surgery.  Men may shave face and neck. Do not bring valuables to the hospital.  Remember to brush your teeth WITH YOUR  REGULAR TOOTHPASTE.

## 2023-10-20 ENCOUNTER — Encounter (HOSPITAL_COMMUNITY): Payer: Self-pay | Admitting: *Deleted

## 2023-10-20 ENCOUNTER — Encounter (HOSPITAL_COMMUNITY)
Admission: RE | Disposition: A | Discharge status: Home / Self Care | Payer: Self-pay | Source: Home / Self Care | Attending: Otolaryngology

## 2023-10-20 ENCOUNTER — Other Ambulatory Visit: Payer: Self-pay

## 2023-10-20 ENCOUNTER — Ambulatory Visit (HOSPITAL_BASED_OUTPATIENT_CLINIC_OR_DEPARTMENT_OTHER): Payer: Medicaid Other | Admitting: Anesthesiology

## 2023-10-20 ENCOUNTER — Observation Stay (HOSPITAL_COMMUNITY)
Admission: RE | Admit: 2023-10-20 | Discharge: 2023-10-21 | Disposition: A | Discharge status: Home / Self Care | Payer: Medicaid Other | Attending: Otolaryngology | Admitting: Otolaryngology

## 2023-10-20 ENCOUNTER — Ambulatory Visit (HOSPITAL_COMMUNITY): Payer: Medicaid Other | Admitting: Anesthesiology

## 2023-10-20 DIAGNOSIS — S02652A Fracture of angle of left mandible, initial encounter for closed fracture: Secondary | ICD-10-CM

## 2023-10-20 DIAGNOSIS — I10 Essential (primary) hypertension: Secondary | ICD-10-CM | POA: Insufficient documentation

## 2023-10-20 DIAGNOSIS — S02609A Fracture of mandible, unspecified, initial encounter for closed fracture: Principal | ICD-10-CM | POA: Diagnosis present

## 2023-10-20 DIAGNOSIS — F17210 Nicotine dependence, cigarettes, uncomplicated: Secondary | ICD-10-CM | POA: Diagnosis not present

## 2023-10-20 DIAGNOSIS — X58XXXA Exposure to other specified factors, initial encounter: Secondary | ICD-10-CM | POA: Insufficient documentation

## 2023-10-20 HISTORY — PX: ORIF MANDIBULAR FRACTURE: SHX2127

## 2023-10-20 HISTORY — PX: TOOTH EXTRACTION: SHX859

## 2023-10-20 SURGERY — OPEN REDUCTION INTERNAL FIXATION (ORIF) MANDIBULAR FRACTURE
Anesthesia: General

## 2023-10-20 MED ORDER — ONDANSETRON HCL 4 MG/2ML IJ SOLN
INTRAMUSCULAR | Status: DC | PRN
  Administered 2023-10-20: 4 mg via INTRAVENOUS

## 2023-10-20 MED ORDER — PROPOFOL 10 MG/ML IV BOLUS
INTRAVENOUS | Status: DC | PRN
  Administered 2023-10-20: 50 mg via INTRAVENOUS
  Administered 2023-10-20: 200 mg via INTRAVENOUS

## 2023-10-20 MED ORDER — OXYMETAZOLINE HCL 0.05 % NA SOLN
NASAL | Status: AC
  Filled 2023-10-20: qty 30

## 2023-10-20 MED ORDER — SUCCINYLCHOLINE CHLORIDE 200 MG/10ML IV SOSY
PREFILLED_SYRINGE | INTRAVENOUS | Status: DC | PRN
  Administered 2023-10-20: 180 mg via INTRAVENOUS

## 2023-10-20 MED ORDER — SUGAMMADEX SODIUM 200 MG/2ML IV SOLN
INTRAVENOUS | Status: DC | PRN
  Administered 2023-10-20: 400 mg via INTRAVENOUS

## 2023-10-20 MED ORDER — MIDAZOLAM HCL 2 MG/2ML IJ SOLN
INTRAMUSCULAR | Status: AC
  Filled 2023-10-20: qty 2

## 2023-10-20 MED ORDER — CHLORHEXIDINE GLUCONATE 0.12 % MT SOLN
OROMUCOSAL | Status: AC
  Administered 2023-10-20: 15 mL via OROMUCOSAL
  Filled 2023-10-20: qty 15

## 2023-10-20 MED ORDER — DEXMEDETOMIDINE HCL IN NACL 80 MCG/20ML IV SOLN
INTRAVENOUS | Status: DC | PRN
  Administered 2023-10-20 (×2): 8 ug via INTRAVENOUS
  Administered 2023-10-20: 12 ug via INTRAVENOUS

## 2023-10-20 MED ORDER — OXYCODONE HCL 5 MG PO TABS: 5.0000 mg | ORAL_TABLET | Freq: Once | ORAL | Status: DC | PRN

## 2023-10-20 MED ORDER — ACETAMINOPHEN 325 MG PO TABS: 325.0000 mg | ORAL_TABLET | ORAL | Status: DC | PRN

## 2023-10-20 MED ORDER — LABETALOL HCL 5 MG/ML IV SOLN
INTRAVENOUS | Status: AC
  Filled 2023-10-20: qty 4

## 2023-10-20 MED ORDER — LACTATED RINGERS IV SOLN: INTRAVENOUS | Status: DC

## 2023-10-20 MED ORDER — AMLODIPINE BESYLATE 5 MG PO TABS
5.0000 mg | ORAL_TABLET | Freq: Every day | ORAL | Status: DC
  Administered 2023-10-20 – 2023-10-21 (×2): 5 mg via ORAL
  Filled 2023-10-20 (×2): qty 1

## 2023-10-20 MED ORDER — FENTANYL CITRATE (PF) 100 MCG/2ML IJ SOLN: 25.0000 ug | INTRAMUSCULAR | Status: DC | PRN

## 2023-10-20 MED ORDER — PROPOFOL 500 MG/50ML IV EMUL
INTRAVENOUS | Status: DC | PRN
  Administered 2023-10-20: 75 ug/kg/min via INTRAVENOUS

## 2023-10-20 MED ORDER — LIDOCAINE 2% (20 MG/ML) 5 ML SYRINGE
INTRAMUSCULAR | Status: DC | PRN
  Administered 2023-10-20: 60 mg via INTRAVENOUS

## 2023-10-20 MED ORDER — OXYCODONE HCL 5 MG/5ML PO SOLN
5.0000 mg | Freq: Once | ORAL | Status: DC | PRN
Start: 2023-10-20 — End: 2023-10-20

## 2023-10-20 MED ORDER — FENTANYL CITRATE (PF) 250 MCG/5ML IJ SOLN
INTRAMUSCULAR | Status: AC
  Filled 2023-10-20: qty 5

## 2023-10-20 MED ORDER — FENTANYL CITRATE (PF) 250 MCG/5ML IJ SOLN
INTRAMUSCULAR | Status: DC | PRN
  Administered 2023-10-20 (×2): 50 ug via INTRAVENOUS
  Administered 2023-10-20: 100 ug via INTRAVENOUS

## 2023-10-20 MED ORDER — EPINEPHRINE HCL (NASAL) 0.1 % NA SOLN
NASAL | Status: AC
  Filled 2023-10-20: qty 30

## 2023-10-20 MED ORDER — LABETALOL HCL 5 MG/ML IV SOLN
INTRAVENOUS | Status: DC | PRN
  Administered 2023-10-20: 5 mg via INTRAVENOUS

## 2023-10-20 MED ORDER — SODIUM CHLORIDE 0.9 % IV SOLN: INTRAVENOUS | Status: DC | PRN

## 2023-10-20 MED ORDER — LIDOCAINE-EPINEPHRINE 1 %-1:100000 IJ SOLN
INTRAMUSCULAR | Status: DC | PRN
  Administered 2023-10-20: 5 mL

## 2023-10-20 MED ORDER — OXYMETAZOLINE HCL 0.05 % NA SOLN
NASAL | Status: DC | PRN
  Administered 2023-10-20: 1 via NASAL

## 2023-10-20 MED ORDER — ACETAMINOPHEN 10 MG/ML IV SOLN
INTRAVENOUS | Status: AC
  Filled 2023-10-20: qty 100

## 2023-10-20 MED ORDER — CHLORHEXIDINE GLUCONATE 0.12 % MT SOLN
15.0000 mL | Freq: Once | OROMUCOSAL | Status: AC
Start: 2023-10-20 — End: 2023-10-20

## 2023-10-20 MED ORDER — ORAL CARE MOUTH RINSE: 15.0000 mL | Freq: Once | OROMUCOSAL | Status: AC

## 2023-10-20 MED ORDER — LIDOCAINE-EPINEPHRINE 1 %-1:100000 IJ SOLN
INTRAMUSCULAR | Status: AC
  Filled 2023-10-20: qty 1

## 2023-10-20 MED ORDER — ROCURONIUM BROMIDE 10 MG/ML (PF) SYRINGE
PREFILLED_SYRINGE | INTRAVENOUS | Status: DC | PRN
  Administered 2023-10-20: 40 mg via INTRAVENOUS

## 2023-10-20 MED ORDER — MORPHINE SULFATE (PF) 2 MG/ML IV SOLN
2.0000 mg | INTRAVENOUS | Status: DC | PRN
  Administered 2023-10-20 – 2023-10-21 (×3): 2 mg via INTRAVENOUS
  Filled 2023-10-20 (×3): qty 1

## 2023-10-20 MED ORDER — SODIUM CHLORIDE 0.9 % IV SOLN
3.0000 g | Freq: Four times a day (QID) | INTRAVENOUS | Status: AC
  Administered 2023-10-20 – 2023-10-21 (×4): 3 g via INTRAVENOUS
  Filled 2023-10-20 (×4): qty 8

## 2023-10-20 MED ORDER — ACETAMINOPHEN 10 MG/ML IV SOLN
1000.0000 mg | Freq: Once | INTRAVENOUS | Status: DC | PRN
Start: 2023-10-20 — End: 2023-10-20

## 2023-10-20 MED ORDER — DEXAMETHASONE SODIUM PHOSPHATE 10 MG/ML IJ SOLN
INTRAMUSCULAR | Status: DC | PRN
  Administered 2023-10-20: 10 mg via INTRAVENOUS

## 2023-10-20 MED ORDER — ACETAMINOPHEN 160 MG/5ML PO SOLN
325.0000 mg | ORAL | Status: DC | PRN
Start: 2023-10-20 — End: 2023-10-20

## 2023-10-20 MED ORDER — DROPERIDOL 2.5 MG/ML IJ SOLN: 0.6250 mg | Freq: Once | INTRAMUSCULAR | Status: DC | PRN

## 2023-10-20 MED ORDER — MIDAZOLAM HCL 2 MG/2ML IJ SOLN
INTRAMUSCULAR | Status: DC | PRN
  Administered 2023-10-20 (×2): 1 mg via INTRAVENOUS

## 2023-10-20 SURGICAL SUPPLY — 76 items
BAG COUNTER SPONGE SURGICOUNT (BAG) ×2 IMPLANT
BIT DRILL 1.5X50 7MMSTP W/NTCH (BIT) IMPLANT
BIT DRILL 1.6X115 (BIT) ×1 IMPLANT
BIT DRILL 1.6X115MM (BIT) IMPLANT
BLADE SURG 10 STRL SS (BLADE) IMPLANT
BLADE SURG 15 STRL LF DISP TIS (BLADE) ×2 IMPLANT
BUR CROSS CUT FISSURE 1.6 (BURR) IMPLANT
BUR EGG ELITE 4.0 (BURR) IMPLANT
CANISTER SUCT 1200ML W/VALVE (MISCELLANEOUS) ×1 IMPLANT
CANISTER SUCT 3000ML PPV (MISCELLANEOUS) ×1 IMPLANT
CATH ROBINSON RED A/P 10FR (CATHETERS) IMPLANT
CLEANER TIP ELECTROSURG 2X2 (MISCELLANEOUS) ×1 IMPLANT
COVER BACK TABLE 60X90IN (DRAPES) ×1 IMPLANT
COVER MAYO STAND STRL (DRAPES) ×1 IMPLANT
COVER SURGICAL LIGHT HANDLE (MISCELLANEOUS) ×2 IMPLANT
DRAPE HALF SHEET 40X57 (DRAPES) IMPLANT
DRAPE U-SHAPE 76X120 STRL (DRAPES) IMPLANT
DRILL 1.5X50 7MM STP W/NOTCH (BIT) ×1 IMPLANT
ELECT COATED BLADE 2.86 ST (ELECTRODE) IMPLANT
ELECT NDL BLADE 2-5/6 (NEEDLE) IMPLANT
ELECT NEEDLE BLADE 2-5/6 (NEEDLE) IMPLANT
ELECT REM PT RETURN 9FT ADLT (ELECTROSURGICAL) ×1 IMPLANT
ELECTRODE REM PT RTRN 9FT ADLT (ELECTROSURGICAL) ×1 IMPLANT
GAUZE PACKING FOLDED 2 STR (GAUZE/BANDAGES/DRESSINGS) IMPLANT
GLOVE BIO SURGEON STRL SZ 6.5 (GLOVE) IMPLANT
GLOVE BIOGEL PI IND STRL 6.5 (GLOVE) IMPLANT
GLOVE BIOGEL PI IND STRL 7.0 (GLOVE) IMPLANT
GLOVE ECLIPSE 7.5 STRL STRAW (GLOVE) ×1 IMPLANT
GLOVE ORTHO TXT STRL SZ7.5 (GLOVE) ×1 IMPLANT
GOWN STRL REUS W/ TWL LRG LVL3 (GOWN DISPOSABLE) ×5 IMPLANT
GOWN STRL REUS W/ TWL XL LVL3 (GOWN DISPOSABLE) ×1 IMPLANT
IV NS 500ML BAXH (IV SOLUTION) ×1 IMPLANT
KIT BASIN OR (CUSTOM PROCEDURE TRAY) ×2 IMPLANT
KIT TURNOVER KIT B (KITS) ×1 IMPLANT
NDL 22X1.5 STRL (OR ONLY) (MISCELLANEOUS) ×1 IMPLANT
NDL DENTAL 27 LONG (NEEDLE) ×1 IMPLANT
NDL HYPO 25GX1X1/2 BEV (NEEDLE) IMPLANT
NEEDLE 22X1.5 STRL (OR ONLY) (MISCELLANEOUS) ×1 IMPLANT
NEEDLE DENTAL 27 LONG (NEEDLE) ×1 IMPLANT
NEEDLE HYPO 25GX1X1/2 BEV (NEEDLE) IMPLANT
NS IRRIG 1000ML POUR BTL (IV SOLUTION) ×2 IMPLANT
PAD ARMBOARD 7.5X6 YLW CONV (MISCELLANEOUS) ×2 IMPLANT
PATTIES SURGICAL .5 X3 (DISPOSABLE) IMPLANT
PENCIL FOOT CONTROL (ELECTRODE) ×1 IMPLANT
PLATE LOCK STRT 4H 1/SCREW (Plate) IMPLANT
POSITIONER HEAD DONUT 9IN (MISCELLANEOUS) IMPLANT
PROTECTOR CORNEAL (OPHTHALMIC RELATED) IMPLANT
SCISSORS WIRE ANG 4 3/4 DISP (INSTRUMENTS) IMPLANT
SCREW NON LOCK X-DR 2.0X6 (Screw) IMPLANT
SCREW NON LOCK X-DR 2.0X8 (Screw) IMPLANT
SCREW NON LOCK X-DR 2.3X10 (Screw) IMPLANT
SCREW NON LOCK X-DR 2.3X8 (Screw) IMPLANT
SCREW SD IMF HEX 2.0X7 (Screw) IMPLANT
SCREW SD IMF HEX 2.0X9 (Screw) IMPLANT
SCREW X-DR EMERG 2.7X10 (Screw) IMPLANT
SPIKE FLUID TRANSFER (MISCELLANEOUS) ×1 IMPLANT
SPONGE SURGIFOAM ABS GEL 100 (HEMOSTASIS) IMPLANT
SPONGE SURGIFOAM ABS GEL 12-7 (HEMOSTASIS) IMPLANT
SUT CHROMIC 3 0 PS 2 (SUTURE) ×1 IMPLANT
SUT CHROMIC 3 0 SH 27 (SUTURE) IMPLANT
SUT MNCRL AB 4-0 PS2 18 (SUTURE) ×1 IMPLANT
SUT NYLON ETHILON 5-0 P-3 1X18 (SUTURE) ×1 IMPLANT
SUT SILK 2 0 PERMA HAND 18 BK (SUTURE) IMPLANT
SUT STEEL 0 18XMFL TIE 17 (SUTURE) IMPLANT
SUT STEEL 4 (SUTURE) ×1 IMPLANT
SYR 50ML LL SCALE MARK (SYRINGE) ×1 IMPLANT
SYR CONTROL 10ML LL (SYRINGE) ×1 IMPLANT
TOOTHBRUSH ADULT (PERSONAL CARE ITEMS) ×1 IMPLANT
TOWEL GREEN STERILE (TOWEL DISPOSABLE) ×1 IMPLANT
TOWEL GREEN STERILE FF (TOWEL DISPOSABLE) ×1 IMPLANT
TOWEL OR NON WOVEN STRL DISP B (DISPOSABLE) IMPLANT
TRAY ENT MC OR (CUSTOM PROCEDURE TRAY) ×1 IMPLANT
TUBE CONNECTING 20X1/4 (TUBING) ×1 IMPLANT
VENT IRR SPI W TUB AD (MISCELLANEOUS) ×1 IMPLANT
WATER STERILE IRR 1000ML POUR (IV SOLUTION) ×1 IMPLANT
YANKAUER SUCT BULB TIP NO VENT (SUCTIONS) ×1 IMPLANT

## 2023-10-20 NOTE — Anesthesia Procedure Notes (Signed)
Procedure Name: Intubation Date/Time: 10/20/2023 10:33 AM  Performed by: Jimmey Ralph, CRNAPre-anesthesia Checklist: Patient identified, Emergency Drugs available, Suction available and Patient being monitored Patient Re-evaluated:Patient Re-evaluated prior to induction Oxygen Delivery Method: Circle System Utilized Preoxygenation: Pre-oxygenation with 100% oxygen Induction Type: IV induction Laryngoscope Size: Glidescope and 4 Grade View: Grade II Tube type: Parker flex tip Nasal Tubes: Nasal Rae, Nasal prep performed, Magill forceps- large, utilized and Right Tube size: 7.0 mm Number of attempts: 1 Placement Confirmation: ETT inserted through vocal cords under direct vision, positive ETCO2 and breath sounds checked- equal and bilateral Secured at: 27 cm Tube secured with: Tape Dental Injury: Teeth and Oropharynx as per pre-operative assessment  Difficulty Due To: Difficulty was anticipated, Difficult Airway- due to dentition, Difficult Airway- due to limited oral opening and Difficult Airway- due to immobile epiglottis Future Recommendations: Recommend- induction with short-acting agent, and alternative techniques readily available Comments: Use of red rubber urinary catheter to feed the nasal rae thru right nare; and futher used glidescope to view tip of rae and guide into aw correctly; atraumatifc; afrin used in preop

## 2023-10-20 NOTE — Op Note (Signed)
Preop/postop diagnosis: Mandible fracture and retained molar Procedure: Open reduction internal fixation of angle left mandible fracture Anesthesia: General Estimated blood loss approximately 25 cc Indications: 52 year old with a fracture previously repaired in the parasymphyseal area and MMF was used for the ankle fracture.  He has had continued movement and was bothered by the arch bars.  He cut his own wires.  He now refuses to have the bars and we discussed placing bicortical screws, removing the tooth by oral surgery, and plating the posterior fracture.  He was informed risk and benefits of the procedure and options were discussed all questions were answered and consent was obtained. Procedure: Patient was taken the operating placed in supine addition after nasal endotracheal tube anesthesia and intubation was examined for the occlusion and the previous arch bars were removed removing 8 screws.  Bicortical screws were placed to #9 in the maxilla and 2 #7 in the mandible.  Once this was completed oral surgery came and to remove the molars that was near the fracture line to be dictated in a separate operative report.  Once this was removed a incision was made up onto the retromolar trigone and relaxed incision along the remaining anterior molar.  The fracture line was easily identified.  A 4-hole plate was then fashioned and bent to the proper anatomy.  2 screws were placed superiorly and then the fracture line was aligned and 2 screws were placed in the proximal plate.  This secured the fracture nicely.  The wound was irrigated.  Gelfoam was placed into the extraction site.  The wound was closed with interrupted and running 3-0 chromic.  It was irrigated prior to closure with saline.  The oral cavity oropharynx hypopharynx and esophagus were suctioned with the NG tube.  The wires were then placed through the bicortical screws and the patient was placed in to what appeared to be his normal occlusion.  The  patient was then awakened brought to recovery in stable condition counts correct

## 2023-10-20 NOTE — Interval H&P Note (Signed)
History and Physical Interval Note:  10/20/2023 9:07 AM  [facility] Edward Mooney  has presented today for surgery, with the diagnosis of mandible fracture.  The various methods of treatment have been discussed with the patient and family. After consideration of risks, benefits and other options for treatment, the patient has consented to  Procedure(s): OPEN REDUCTION INTERNAL FIXATION (ORIF) MANDIBULAR FRACTURE (N/A) DENTAL RESTORATION/EXTRACTIONS (N/A) as a surgical intervention.  The patient's history has been reviewed, patient examined, no change in status, stable for surgery.  I have reviewed the patient's chart and labs.  Questions were answered to the patient's satisfaction.     Suzanna Obey

## 2023-10-20 NOTE — Anesthesia Postprocedure Evaluation (Addendum)
Anesthesia Post Note  Patient: Edward Mooney  Procedure(s) Performed: OPEN REDUCTION INTERNAL FIXATION (ORIF) MANDIBULAR FRACTURE DENTAL RESTORATION/EXTRACTIONS     Patient location during evaluation: PACU Anesthesia Type: General Level of consciousness: awake and alert Pain management: pain level controlled Vital Signs Assessment: post-procedure vital signs reviewed and stable Respiratory status: spontaneous breathing, nonlabored ventilation, respiratory function stable and patient connected to nasal cannula oxygen Cardiovascular status: blood pressure returned to baseline and stable Postop Assessment: no apparent nausea or vomiting Anesthetic complications: yes   Encounter Notable Events  Notable Event Outcome Phase Comment  Difficult to intubate - expected  Intraprocedure Filed from anesthesia note documentation.    Last Vitals:  Vitals:   10/20/23 1228 10/20/23 1249  BP:  (!) 187/113  Pulse: 72 64  Resp: 14 20  Temp: 36.7 C (!) 36.4 C  SpO2: 100% 100%                Shelton Silvas

## 2023-10-20 NOTE — Plan of Care (Signed)

## 2023-10-20 NOTE — Anesthesia Preprocedure Evaluation (Addendum)
Anesthesia Evaluation  Patient identified by MRN, date of birth, ID band Patient awake    Reviewed: Allergy & Precautions, NPO status , Patient's Chart, lab work & pertinent test results  Airway Mallampati: Unable to assess  TM Distance: >3 FB     Dental  (+) Poor Dentition, Missing   Pulmonary Current Smoker and Patient abstained from smoking.   breath sounds clear to auscultation       Cardiovascular hypertension, Pt. on medications  Rhythm:Regular Rate:Normal     Neuro/Psych negative neurological ROS  negative psych ROS   GI/Hepatic negative GI ROS, Neg liver ROS,,,  Endo/Other  negative endocrine ROS    Renal/GU negative Renal ROS     Musculoskeletal negative musculoskeletal ROS (+)    Abdominal   Peds  Hematology negative hematology ROS (+)   Anesthesia Other Findings   Reproductive/Obstetrics                             Anesthesia Physical Anesthesia Plan  ASA: 2  Anesthesia Plan: General   Post-op Pain Management: Tylenol PO (pre-op)* and Toradol IV (intra-op)*   Induction: Intravenous  PONV Risk Score and Plan: 2 and Ondansetron, Dexamethasone and Midazolam  Airway Management Planned: Video Laryngoscope Planned and Nasal ETT  Additional Equipment: None  Intra-op Plan:   Post-operative Plan: Extubation in OR  Informed Consent: I have reviewed the patients History and Physical, chart, labs and discussed the procedure including the risks, benefits and alternatives for the proposed anesthesia with the patient or authorized representative who has indicated his/her understanding and acceptance.     Interpreter used for interview  Plan Discussed with: CRNA  Anesthesia Plan Comments:        Anesthesia Quick Evaluation

## 2023-10-20 NOTE — Interval H&P Note (Signed)
History and Physical Interval Note:  10/20/2023 9:06 AM  [facility] Edward Mooney  has presented today for surgery, with the diagnosis of mandible fracture.  The various methods of treatment have been discussed with the patient and family. After consideration of risks, benefits and other options for treatment, the patient has consented to  Procedure(s): OPEN REDUCTION INTERNAL FIXATION (ORIF) MANDIBULAR FRACTURE (N/A) DENTAL RESTORATION/EXTRACTIONS (N/A) as a surgical intervention.  The patient's history has been reviewed, patient examined, no change in status, stable for surgery.  I have reviewed the patient's chart and labs.  Questions were answered to the patient's satisfaction.     Suzanna Obey

## 2023-10-20 NOTE — Transfer of Care (Signed)
Immediate Anesthesia Transfer of Care Note  Patient: Edward Mooney  Procedure(s) Performed: OPEN REDUCTION INTERNAL FIXATION (ORIF) MANDIBULAR FRACTURE DENTAL RESTORATION/EXTRACTIONS  Patient Location: PACU  Anesthesia Type:General  Level of Consciousness: awake, alert , and oriented  Airway & Oxygen Therapy: Patient Spontanous Breathing and Patient connected to face mask oxygen  Post-op Assessment: Report given to RN and Post -op Vital signs reviewed and stable  Post vital signs: Reviewed and stable  Last Vitals:  Vitals Value Taken Time  BP 197/95 10/20/23 1200  Temp    Pulse 84 10/20/23 1203  Resp 18 10/20/23 1203  SpO2 97 % 10/20/23 1203  Vitals shown include unfiled device data.  Last Pain:  Vitals:   10/20/23 0843  PainSc: 3          Complications:  Encounter Notable Events  Notable Event Outcome Phase Comment  Difficult to intubate - expected  Intraprocedure Filed from anesthesia note documentation.

## 2023-10-21 ENCOUNTER — Other Ambulatory Visit (HOSPITAL_COMMUNITY): Payer: Self-pay

## 2023-10-21 ENCOUNTER — Encounter (HOSPITAL_COMMUNITY): Payer: Self-pay | Admitting: Otolaryngology

## 2023-10-21 DIAGNOSIS — S02652A Fracture of angle of left mandible, initial encounter for closed fracture: Secondary | ICD-10-CM | POA: Diagnosis not present

## 2023-10-21 DIAGNOSIS — I10 Essential (primary) hypertension: Secondary | ICD-10-CM | POA: Diagnosis not present

## 2023-10-21 DIAGNOSIS — F17210 Nicotine dependence, cigarettes, uncomplicated: Secondary | ICD-10-CM | POA: Diagnosis not present

## 2023-10-21 MED ORDER — CEPHALEXIN 250 MG/5ML PO SUSR
500.0000 mg | Freq: Three times a day (TID) | ORAL | 0 refills | Status: DC
  Filled 2023-10-21 – 2023-10-26 (×2): qty 300, 10d supply, fill #0
  Filled 2023-10-26: qty 100, 4d supply, fill #0
  Filled 2023-10-26: qty 200, 6d supply, fill #0

## 2023-10-21 NOTE — Plan of Care (Signed)
  Problem: Pain Management: Goal: General experience of comfort will improve Outcome: Progressing   Problem: Safety: Goal: Ability to remain free from injury will improve Outcome: Progressing

## 2023-10-21 NOTE — Discharge Summary (Signed)
  Patient ID: Edward Mooney MRN: [id number] DOB/AGE: 53-18-1972 53 y.o.  Admit date: 10/20/2023 Discharge date: 10/21/2023  Admission Diagnoses:mandible fracture  Discharge Diagnoses: same Principal Problem:   Mandible fracture [facility])   Discharged Condition: good  Hospital Course: admitted for observation after anesthesia. He is doing great and has no further pain. There is no movement of the teeth. Wires are solid. No swelling discharged to follow up in December 30th  Consults: None  Significant Diagnostic Studies: none  Treatments: surgery: ORIF mandible fx and tooth extraction  Discharge Exam: Blood pressure (!) 144/90, pulse 72, temperature 98.2 F (36.8 C), temperature source Oral, resp. rate 19, height 5\' 11"  (1.803 m), weight 65 kg, SpO2 98%. PHYSICAL EXAM: Alert awake. Wires solid and no movement. No swelling. Swallowing well. Cv regular. Lungs clear. Ext no tenderness or swelling  Disposition:  There are no questions and answers to display.            Signed: Suzanna Obey 10/21/2023, 8:35 AM   jmb

## 2023-10-21 NOTE — Progress Notes (Signed)
DISCHARGE NOTE HOME Edward Mooney to be discharged Home per MD order. Discussed prescriptions and follow up appointments with the patient. Prescriptions given to patient; medication list explained in detail. Patient verbalized understanding.  Skin clean, dry and intact without evidence of skin break down, no evidence of skin tears noted. IV catheter discontinued intact. Site without signs and symptoms of complications. Dressing and pressure applied. Pt denies pain at the site currently. No complaints noted.  Patient free of lines, drains, and wounds.   An After Visit Summary (AVS) was printed and given to the patient. Patient escorted via wheelchair, and discharged home via bus. Bus pass provided to patient.  [name], RN

## 2023-10-21 NOTE — TOC Transition Note (Signed)
Transition of Care [facility]) - Discharge Note   Patient Details  Name: Edward Mooney MRN: [id number] Date of Birth: 1971/01/28  [facility] CM/SW Contact:  Tom-Johnson, Hershal Coria, RN Phone Number: 10/21/2023, 9:22 AM   Clinical Narrative:      Patient is scheduled for discharge today for observation after Anesthesia. Patient underwent  ORIF Mandible Fx and Tooth Extraction yesterday 10/20/23 by ENT.  Hospital f/u and discharge instructions on AVS. Prescriptions sent to [facility] pharmacy and patient will receive meds prior discharge. Bus Pass given to patient at his request for transportion at discharge.  No further TOC needs noted.       Final next level of care: Home/Self Care Barriers to Discharge: Barriers Resolved   Patient Goals and CMS Choice Patient states their goals for this hospitalization and ongoing recovery are:: To return home CMS Medicare.gov Compare Post Acute Care list provided to:: Patient Choice offered to / list presented to : NA      Discharge Placement                Patient to be transferred to facility by: [facility]      Discharge Plan and Services Additional resources added to the After Visit Summary for                  DME Arranged: N/A DME Agency: NA       HH Arranged: NA HH Agency: NA        Social Drivers of Health (SDOH) Interventions SDOH Screenings   Food Insecurity: No Food Insecurity (10/20/2023)  Housing: Low Risk  (10/20/2023)  Recent Concern: Housing - High Risk (10/05/2023)  Transportation Needs: No Transportation Needs (10/20/2023)  Recent Concern: Transportation Needs - Unmet Transportation Needs (10/05/2023)  Utilities: Not At Risk (10/20/2023)  Depression (PHQ2-9): Low Risk  (09/25/2020)  Tobacco Use: High Risk (10/20/2023)     Readmission Risk Interventions     No data to display

## 2023-10-26 ENCOUNTER — Other Ambulatory Visit (HOSPITAL_COMMUNITY): Payer: Self-pay

## 2023-10-26 NOTE — Op Note (Signed)
10/20/2023  12:41 AM  PATIENT:  Salome Holmes Alghayeb Vanderheiden  52 y.o. male  PRE-OPERATIVE DIAGNOSIS:  mandible fracture  POST-OPERATIVE DIAGNOSIS:  mandible fracture  PROCEDURE: Simple extraction tooth #17  SURGEON:  Surgeons and Role: Panel 1:    Suzanna Obey, MD - Primary Panel 2:    Vivia Ewing, DMD - Primary  ANESTHESIA:   general  EBL:  <67mL  Complications: none  Indication for procedure: Patient is a 52 y/o M with previous mandibular trauma s/p repair by Dr. Jearld Fenton. He was readmitted due to non-compliance with MMF, now requiring ORIF of left mand angle fracture. OMS consulted for removal of tooth within the fracture line.  Procedure: When I arrived in the OR the patient was already under anesthesia and prepped and draped.  See Dr. Jearld Fenton note for specifics. Tooth #17 was identified in the fracture line, it was luxated and removed with a dental forcep without complication. The patient was returned to Dr. Jearld Fenton for completion of the procedure. Please see his dictation for details.

## 2023-10-30 ENCOUNTER — Other Ambulatory Visit: Payer: Self-pay

## 2023-10-30 ENCOUNTER — Encounter (HOSPITAL_COMMUNITY): Payer: Self-pay

## 2023-10-30 ENCOUNTER — Emergency Department (HOSPITAL_COMMUNITY)
Admission: EM | Admit: 2023-10-30 | Discharge: 2023-10-30 | Disposition: A | Discharge status: Home / Self Care | Payer: Medicaid Other | Attending: Emergency Medicine | Admitting: Emergency Medicine

## 2023-10-30 DIAGNOSIS — I10 Essential (primary) hypertension: Secondary | ICD-10-CM | POA: Insufficient documentation

## 2023-10-30 DIAGNOSIS — G8918 Other acute postprocedural pain: Secondary | ICD-10-CM | POA: Insufficient documentation

## 2023-10-30 DIAGNOSIS — R6884 Jaw pain: Secondary | ICD-10-CM | POA: Diagnosis not present

## 2023-10-30 DIAGNOSIS — Z79899 Other long term (current) drug therapy: Secondary | ICD-10-CM | POA: Insufficient documentation

## 2023-10-30 DIAGNOSIS — T8189XA Other complications of procedures, not elsewhere classified, initial encounter: Secondary | ICD-10-CM | POA: Insufficient documentation

## 2023-10-30 MED ORDER — AMOXICILLIN-POT CLAVULANATE 400-57 MG/5ML PO SUSR
875.0000 mg | Freq: Two times a day (BID) | ORAL | 0 refills | Status: AC
  Filled 2023-10-30: qty 200, 9d supply, fill #0

## 2023-10-30 MED ORDER — OXYCODONE HCL 5 MG/5ML PO SOLN: 5.0000 mg | Freq: Four times a day (QID) | ORAL | 0 refills | Status: DC | PRN

## 2023-10-30 MED ORDER — LIDOCAINE-EPINEPHRINE (PF) 2 %-1:200000 IJ SOLN
10.0000 mL | Freq: Once | INTRAMUSCULAR | Status: AC
  Administered 2023-10-30: 10 mL via INTRADERMAL

## 2023-10-30 MED ORDER — AMOXICILLIN-POT CLAVULANATE 400-57 MG/5ML PO SUSR: 875.0000 mg | Freq: Two times a day (BID) | ORAL | 0 refills | Status: DC

## 2023-10-30 MED ORDER — OXYCODONE HCL 5 MG/5ML PO SOLN
5.0000 mg | Freq: Four times a day (QID) | ORAL | 0 refills | Status: AC | PRN
  Filled 2023-10-30: qty 50, 3d supply, fill #0

## 2023-10-30 NOTE — Procedures (Signed)
Procedure note: Preop/postop diagnosis: Mandible fracture s/p ORIF and IMF Procedure: Replacement of right IMF screw/hardware Anesthesia: local Estimated blood loss: minimal  Indications: 52 yo with history of bilateral mandible Fx s/p IMF/ORIF in Dec 2024 presents because one of his IMF screws came out. Given recent fixation and slight mobility, options were discussed including replacement or not. He opted for replacement. He was informed risk and benefits of the procedure and options were discussed including risks of infection, malocclusion, misalignment. All questions were answered and verbal consent was obtained.  Procedure: 1% Lidocaine with 1:100000 epinephrine was injected around mandibular gingiva where screw had come out and right mental nerve block performed. The prior wire and mandible screw on right was cut and removed. A new 9mm IMF screw was loaded and given poor bone stock and poor gingiva, was placed in a more buccal region/more posterior. A 24 gauge wire was then looped through the IMF loops and secured to what appeared prior/normal occlusion. Patient reported his occlusion was subjectively at baseline afterwards. The screws were felt to be secure and tight.  All instrumentation was removed. Oral cavity was rinsed with saline. He tolerated procedure well. Wound was hemostatic.  Dispo: see consult note  Read Drivers

## 2023-10-30 NOTE — ED Triage Notes (Signed)
Patient recent had jaw wired shut due to a mandible fx but was trying to eat something and wire broke and is hanging out.

## 2023-10-30 NOTE — Consult Note (Signed)
ENT CONSULT:  Reason for Consult: Post-op problem after ORIF/IMF Mandible  HPI: Sade Lachat is an 52 y.o. male with history of bilateral mandible Fx s/p MMF/ORIF in early Dec 2024, cut his own wires. Underwent ORIF of mandible fracture, dental extraction and IMF on Dec 18. He returns today because one of his IMF screws came out. Denies malocclusion, pain, swelling, fevers/chills. He, however, though reports he has been eating everything. Was eating sandwich in triage. Offered interpreter but declined.  Past Medical History:  Diagnosis Date   Hypertension    Tobacco abuse     Past Surgical History:  Procedure Laterality Date   ABDOMINAL SURGERY     In [place] (2000) in prison, had surgery on abdomen, uncertain if any organs removed, had emesis prior to surgery    ORIF MANDIBULAR FRACTURE N/A 10/05/2023   Procedure: OPEN REDUCTION INTERNAL FIXATION (ORIF) MANDIBULAR FRACTURE;  Surgeon: Suzanna Obey, MD;  Location: [facility] OR;  Service: ENT;  Laterality: N/A;   ORIF MANDIBULAR FRACTURE N/A 10/20/2023   Procedure: OPEN REDUCTION INTERNAL FIXATION (ORIF) MANDIBULAR FRACTURE;  Surgeon: Suzanna Obey, MD;  Location: [facility] OR;  Service: ENT;  Laterality: N/A;   TOOTH EXTRACTION N/A 10/20/2023   Procedure: DENTAL RESTORATION/EXTRACTIONS;  Surgeon: Vivia Ewing, DMD;  Location: [facility] OR;  Service: Oral Surgery;  Laterality: N/A;    History reviewed. No pertinent family history.  Social History:  reports that he has been smoking cigarettes. He started smoking about 4 years ago. He has a 5 pack-year smoking history. He has never used smokeless tobacco. He reports current alcohol use. He reports that he does not currently use drugs.  Allergies: No Known Allergies  Medications: I have reviewed the patient's current medications.  No results found for this or any previous visit (from the past 48 hours).  No results found.  ROS:ROS  Blood pressure (!) 160/93, pulse 78, temperature 97.7 F (36.5  C), temperature source Oral, resp. rate 17, height 5\' 11"  (1.803 m), weight 64.9 kg, SpO2 100%.  PHYSICAL EXAM:  CONSTITUTIONAL: well developed, nourished, no distress and alert and oriented x 3 CARDIOVASCULAR: normal rate and regular rhythm PULMONARY/CHEST WALL: effort normal and no stridor, no stertor, no dysphonia HENT: Head : normocephalic Ears: Right ear: external ear normal and hearing normal Left ear:   external ear normal and hearing normal Nose: nose normal and no purulence Mouth/Throat:  Mouth: uvula midline and no oral lesions; IMF in place - left in place, right with mandible IMF screw out; minimal right mandibular mobility. Right max IMF screw intact Mucous membranes: normal EYES: conjunctiva normal, EOM normal and PERRL NECK: supple, trachea normal and no thyromegaly or cervical LAD  Studies Reviewed:prior CT and ENT/trauma notes reviewed  Assessment/Plan: Mr. Lubinski is a 52 yo with history of bilateral mandible Fx s/p IMF/ORIF in Dec 2024 presents because one of his IMF screws came out. Minimal mandible mobility given that but IMF screw replaced and rewired. Mandible stable afterwards. No sign of infection  Plan: - Reinforced he is not to eat solids; liquid/no chew diet only - Given replacement of IMF, recommend starting 7d Augmentin liquid and give pain medication - ok for d/c - f/u in 2 weeks in clinic - Dispo per ED  Read Drivers   10/30/2023, 6:35 PM

## 2023-10-30 NOTE — ED Notes (Signed)
Eating a sandwich in triage

## 2023-10-30 NOTE — Discharge Instructions (Signed)
The ear nose and throat doctor will call you to schedule follow-up.  You are being placed on antibiotics.  You should not do any chewing as this will interfere with the healing of your jaw.  You can take the prescribed pain medication but it may make you drowsy so do not drive while taking it.

## 2023-10-30 NOTE — ED Provider Notes (Signed)
[place] EMERGENCY DEPARTMENT AT [facility] Provider Note   CSN: [id number] Arrival date & time: 10/30/23  1322     History  Chief Complaint  Patient presents with   Foreign Body    Edward Mooney Edward Mooney is a 52 y.o. male.   Foreign Body 52 year old male history of hypertension presenting for surgical complication.  Patient had a mandible fracture in early December.  This was managed with surgery on the third and 18th with open reduction and internal fixation.  He has been doing well, around 10 AM today however he was trying to put food behind the wires on the right side of his jaw and the mandibular fixation point for the wire broke off and now is hanging.  He does not have any pain.  No shortness of breath.  Has not eaten since 10 AM.  He is otherwise been at his baseline health.  History taken via virtual arabic interpreter.     Home Medications Prior to Admission medications   Medication Sig Start Date End Date Taking? Authorizing Provider  amLODipine (NORVASC) 5 MG tablet Take 1 tablet (5 mg total) by mouth daily. 10/15/23   Gareth Eagle, PA-C  amoxicillin-clavulanate (AUGMENTIN) 400-57 MG/5ML suspension Take 10.9 mLs (875 mg total) by mouth 2 (two) times daily for 7 days. 10/30/23 11/06/23  Laurence Spates, MD  oxyCODONE (ROXICODONE) 5 MG/5ML solution Take 5 mLs (5 mg total) by mouth every 6 (six) hours as needed for severe pain (pain score 7-10). 10/30/23   Laurence Spates, MD      Allergies    Patient has no known allergies.    Review of Systems   Review of Systems Review of systems completed and notable as per HPI.  ROS otherwise negative.   Physical Exam Updated Vital Signs BP (!) 160/93 (BP Location: Right Arm)   Pulse 76   Temp 97.7 F (36.5 C) (Oral)   Resp 18   Ht 5\' 11"  (1.803 m)   Wt 64.9 kg   SpO2 100%   BMI 19.94 kg/m  Physical Exam Vitals and nursing note reviewed.  Constitutional:      General: He is not in acute  distress.    Appearance: He is well-developed.  HENT:     Head: Normocephalic and atraumatic.     Mouth/Throat:     Mouth: Mucous membranes are moist.     Pharynx: Oropharynx is clear.     Comments: Displacement of the mandibular right-sided wire fixation point.  Maxillary fixation point is intact.  The left side of the MMF fixation is intact.  Poor dentition.  No bleeding or swelling. Eyes:     Extraocular Movements: Extraocular movements intact.     Conjunctiva/sclera: Conjunctivae normal.     Pupils: Pupils are equal, round, and reactive to light.  Cardiovascular:     Rate and Rhythm: Normal rate and regular rhythm.     Heart sounds: No murmur heard. Pulmonary:     Effort: Pulmonary effort is normal. No respiratory distress.     Breath sounds: Normal breath sounds.  Abdominal:     Palpations: Abdomen is soft.     Tenderness: There is no abdominal tenderness.  Musculoskeletal:        General: No swelling.     Cervical back: Neck supple.  Skin:    General: Skin is warm and dry.     Capillary Refill: Capillary refill takes less than 2 seconds.  Neurological:  Mental Status: He is alert.  Psychiatric:        Mood and Affect: Mood normal.        ED Results / Procedures / Treatments   Labs (all labs ordered are listed, but only abnormal results are displayed) Labs Reviewed - No data to display  EKG None  Radiology No results found.  Procedures Procedures    Medications Ordered in ED Medications  lidocaine-EPINEPHrine (XYLOCAINE W/EPI) 2 %-1:200000 (PF) injection 10 mL (10 mLs Intradermal Given 10/30/23 1812)    ED Course/ Medical Decision Making/ A&P Clinical Course as of 10/30/23 2007  Sat Oct 30, 2023  1701 [facility] ENT:  [JD]    Clinical Course User Index [JD] Laurence Spates, MD                                 Medical Decision Making Risk Prescription drug management.   Medical Decision Making:   Edward Mooney is a 52 y.o. male  who presented to the ED today with postop complication.  All signs reviewed.  Patient recently had surgery with bicortical screw and wire placement for maxillomandibular fixation for left-sided mandible fracture.  Unfortunately was trying to put food back there today in the right-sided mandibular cortical screw came out so the wire is now hanging.  He does not have any pain.  He is well-appearing.  No signs of infection.  Pictures taken and put in the chart, will talk with ENT about management.  Reviewed and confirmed nursing documentation for past medical history, family history, social history.  Reassessment and Plan:   ENT evaluated patient Dr. Allena Katz.  They replaced the screw in the ED.  They recommend course of pain medication and Augmentin and they will schedule follow-up.  I reviewed the plan with the patient including prescription for oxycodone solution as well as Augmentin solution as he cannot chew.  Reviewed instructions to not chew.  ENT to schedule follow-up with him.  He was comfortable this plan.  Discharged in stable condition.   Patient's presentation is most consistent with acute, uncomplicated illness.           Final Clinical Impression(s) / ED Diagnoses Final diagnoses:  Post-op pain    Rx / DC Orders ED Discharge Orders          Ordered    amoxicillin-clavulanate (AUGMENTIN) 400-57 MG/5ML suspension  2 times daily,   Status:  Discontinued        10/30/23 1956    oxyCODONE (ROXICODONE) 5 MG/5ML solution  Every 6 hours PRN,   Status:  Discontinued        10/30/23 1956    amoxicillin-clavulanate (AUGMENTIN) 400-57 MG/5ML suspension  2 times daily        10/30/23 2004    oxyCODONE (ROXICODONE) 5 MG/5ML solution  Every 6 hours PRN        10/30/23 2004              Laurence Spates, MD 10/30/23 2007

## 2023-10-30 NOTE — ED Provider Triage Note (Signed)
Emergency Medicine Provider Triage Evaluation Note  [facility] Edward Mooney , a 52 y.o. male  was evaluated in triage.  Pt complains of dental wire break.  Review of Systems  Positive:  Negative:   Physical Exam  BP (!) 160/93 (BP Location: Right Arm)   Pulse 78   Temp 97.7 F (36.5 C) (Oral)   Resp 17   Ht 5\' 11"  (1.803 m)   Wt 64.9 kg   SpO2 100%   BMI 19.94 kg/m  Gen:   Awake, no distress   Resp:  Normal effort  MSK:   Moves extremities without difficulty  Other:    Medical Decision Making  Medically screening exam initiated at 2:14 PM.  Appropriate orders placed.  [facility] Edward Mooney was informed that the remainder of the evaluation will be completed by another provider, this initial triage assessment does not replace that evaluation, and the importance of remaining in the ED until their evaluation is complete.  Open reduction internal fixation of angle left mandible fracture 10/20/2023. Patient trying to eat something and wire broke.   Dorthy Cooler, [place] 10/30/23 1416

## 2023-10-31 ENCOUNTER — Emergency Department (HOSPITAL_COMMUNITY)
Admission: EM | Admit: 2023-10-31 | Discharge: 2023-10-31 | Disposition: A | Discharge status: Home / Self Care | Payer: BLUE CROSS/BLUE SHIELD | Attending: Emergency Medicine | Admitting: Emergency Medicine

## 2023-10-31 DIAGNOSIS — S02609D Fracture of mandible, unspecified, subsequent encounter for fracture with routine healing: Secondary | ICD-10-CM | POA: Insufficient documentation

## 2023-10-31 DIAGNOSIS — X58XXXD Exposure to other specified factors, subsequent encounter: Secondary | ICD-10-CM | POA: Diagnosis not present

## 2023-10-31 DIAGNOSIS — Z79899 Other long term (current) drug therapy: Secondary | ICD-10-CM | POA: Diagnosis not present

## 2023-10-31 DIAGNOSIS — T8189XA Other complications of procedures, not elsewhere classified, initial encounter: Secondary | ICD-10-CM | POA: Diagnosis present

## 2023-10-31 DIAGNOSIS — S02601D Fracture of unspecified part of body of right mandible, subsequent encounter for fracture with routine healing: Secondary | ICD-10-CM | POA: Diagnosis not present

## 2023-10-31 NOTE — ED Provider Notes (Signed)
[place] EMERGENCY DEPARTMENT AT [facility] Provider Note   CSN: [id number] Arrival date & time: 10/31/23  5284     History  Chief Complaint  Patient presents with   mouth problem    Howard Bruer is a 52 y.o. male who presents with postop complication of mandible fracture.  Had a mandibular fracture repaired on the 13th and 18 December with ORIF.  He had been progressing well until he presented yesterday to this ED with a break in the right side wire.  ENT was consulted and the wire was repaired.  He presents again today with similar complaints, with a broken wire reporting he would like the wire to be removed.  He denies any pain or discomfort.  He states he can keep the left-sided wire.   HPI     Home Medications Prior to Admission medications   Medication Sig Start Date End Date Taking? Authorizing Provider  amLODipine (NORVASC) 5 MG tablet Take 1 tablet (5 mg total) by mouth daily. 10/15/23   Gareth Eagle, PA-C  amoxicillin-clavulanate (AUGMENTIN) 400-57 MG/5ML suspension Take 10.9 mLs (875 mg total) by mouth 2 (two) times daily for 7 days. 10/30/23 11/06/23  Laurence Spates, MD  oxyCODONE (ROXICODONE) 5 MG/5ML solution Take 5 mLs (5 mg total) by mouth every 6 (six) hours as needed for severe pain (pain score 7-10). 10/30/23   Laurence Spates, MD      Allergies    Patient has no known allergies.    Review of Systems   Review of Systems  Constitutional:  Negative for fever.    Physical Exam Updated Vital Signs BP (!) 140/79 (BP Location: Right Arm)   Pulse 74   Temp 98.6 F (37 C)   Resp 16   SpO2 100%  Physical Exam Vitals and nursing note reviewed.  Constitutional:      General: He is not in acute distress.    Appearance: He is well-developed.  HENT:     Head: Normocephalic and atraumatic.     Comments: Right-sided mandibular screw,  Pulmonary:     Effort: Pulmonary effort is normal. No respiratory distress.   Musculoskeletal:     Cervical back: Neck supple.  Skin:    General: Skin is warm and dry.     Capillary Refill: Capillary refill takes less than 2 seconds.  Neurological:     Mental Status: He is alert.  Psychiatric:        Mood and Affect: Mood normal.     ED Results / Procedures / Treatments   Labs (all labs ordered are listed, but only abnormal results are displayed) Labs Reviewed - No data to display  EKG None  Radiology No results found.  Procedures Procedures    Medications Ordered in ED Medications - No data to display  ED Course/ Medical Decision Making/ A&P                                 Medical Decision Making  This patient presents to the ED with chief complaint(s) of postop complaint.  The complaint involves an extensive differential diagnosis and also carries with it a high risk of complications and morbidity.   pertinent past medical history as listed in HPI   Additional history obtained:  Records reviewed previous admission documents  Initial Assessment:   Patient is overall well-appearing, presenting with loose mandibular ORIF screw, he denies any  pain or discomfort.  There is no evidence of infection.  He has no trismus or change in phonation.  No concern for Ludwig's angina or retropharyngeal abscess.  I have offered to consult ENT for evaluation.  However he states he is leaving as he has an appointment tomorrow with his surgeon.   Independent ECG interpretation:  none  Independent labs interpretation:  The following labs were independently interpreted:  none  Independent visualization and interpretation of imaging: none  Treatment and Reassessment: none  Consultations obtained:   none  Disposition:   Patient left without receiving discharge papers.  States that he can go home and will be seen by his surgeon tomorrow. The patient has been appropriately medically screened and/or stabilized in the ED. I have low suspicion for any  other emergent medical condition which would require further screening, evaluation or treatment in the ED or require inpatient management. At time of discharge the patient is hemodynamically stable and in no acute distress. I have discussed work-up results and diagnosis with patient and answered all questions. Patient is agreeable with discharge plan. We discussed strict return precautions for returning to the emergency department and they verbalized understanding.     Social Determinants of Health:   Patients impaired access to primary care  increases the complexity of managing their presentation  This note was dictated with voice recognition software.  Despite best efforts at proofreading, errors may have occurred which can change the documentation meaning.          Final Clinical Impression(s) / ED Diagnoses Final diagnoses:  Closed fracture of mandible with routine healing, unspecified laterality, unspecified mandibular site, subsequent encounter    Rx / DC Orders ED Discharge Orders     None         Halford Decamp, PA-C 10/31/23 1349    Melene Plan, DO 10/31/23 1500

## 2023-10-31 NOTE — ED Triage Notes (Signed)
Pt, stated, wires are loose from jaw alignment.

## 2023-10-31 NOTE — Discharge Instructions (Addendum)
Patient left prior to receiving any discharge papers

## 2023-11-01 ENCOUNTER — Other Ambulatory Visit (HOSPITAL_COMMUNITY): Payer: Self-pay

## 2023-12-24 ENCOUNTER — Emergency Department (HOSPITAL_COMMUNITY)
Admission: EM | Admit: 2023-12-24 | Discharge: 2023-12-24 | Disposition: A | Discharge status: Home / Self Care | Payer: BLUE CROSS/BLUE SHIELD | Attending: Emergency Medicine | Admitting: Emergency Medicine

## 2023-12-24 ENCOUNTER — Other Ambulatory Visit: Payer: Self-pay

## 2023-12-24 DIAGNOSIS — Z4800 Encounter for change or removal of nonsurgical wound dressing: Secondary | ICD-10-CM | POA: Diagnosis not present

## 2023-12-24 DIAGNOSIS — Z5189 Encounter for other specified aftercare: Secondary | ICD-10-CM

## 2023-12-24 DIAGNOSIS — Z4801 Encounter for change or removal of surgical wound dressing: Secondary | ICD-10-CM | POA: Diagnosis not present

## 2023-12-24 NOTE — ED Triage Notes (Signed)
 Patient here for suture removal in his mouth from surgery.

## 2023-12-24 NOTE — ED Notes (Signed)
 Patient reports that he lost his BP meds.

## 2023-12-24 NOTE — ED Provider Notes (Signed)
 [place] EMERGENCY DEPARTMENT AT [facility] Provider Note   CSN: [id number] Arrival date & time: 12/24/23  1444     History  Chief Complaint  Patient presents with   Suture / Staple Removal    Edward Mooney is a 53 y.o. male.  53 year old male presents today for concern of wanting to have mandibular screw placed during his mandibular fracture surgery removed today.  Denies bleeding, drainage, or other concerns.  The history is provided by the patient. No language interpreter was used.       Home Medications Prior to Admission medications   Medication Sig Start Date End Date Taking? Authorizing Provider  amLODipine (NORVASC) 5 MG tablet Take 1 tablet (5 mg total) by mouth daily. 10/15/23   Gareth Eagle, PA-C  oxyCODONE (ROXICODONE) 5 MG/5ML solution Take 5 mLs (5 mg total) by mouth every 6 (six) hours as needed for severe pain (pain score 7-10). 10/30/23   Laurence Spates, MD      Allergies    Patient has no known allergies.    Review of Systems   Review of Systems  Constitutional:  Negative for chills and fever.  Skin:  Negative for wound.  All other systems reviewed and are negative.   Physical Exam Updated Vital Signs BP (!) 179/104 (BP Location: Right Arm)   Pulse 86   Temp 98.1 F (36.7 C) (Oral)   Resp 18   SpO2 100%  Physical Exam Vitals and nursing note reviewed.  Constitutional:      General: He is not in acute distress.    Appearance: Normal appearance. He is not ill-appearing.  HENT:     Head: Normocephalic and atraumatic.     Nose: Nose normal.     Mouth/Throat:     Comments: Mandibular screws noted in the upper left (anatomical location) corner of his mouth Eyes:     Conjunctiva/sclera: Conjunctivae normal.  Cardiovascular:     Rate and Rhythm: Normal rate and regular rhythm.  Pulmonary:     Effort: Pulmonary effort is normal. No respiratory distress.  Musculoskeletal:        General: No deformity. Normal  range of motion.     Cervical back: Normal range of motion.  Skin:    Findings: No rash.  Neurological:     Mental Status: He is alert.     ED Results / Procedures / Treatments   Labs (all labs ordered are listed, but only abnormal results are displayed) Labs Reviewed - No data to display  EKG None  Radiology No results found.  Procedures Procedures    Medications Ordered in ED Medications - No data to display  ED Course/ Medical Decision Making/ A&P                                 Medical Decision Making  53 year old male presents today for concern of wanting to have his mandibular screw removed.  This was placed by ENT in the operating room in December.  Currently denies any issues.  He states he would just like to have it removed.  Unable to remove this in the emergency department.  He will need to follow-up with ENT in the clinic.  ENT information given.  He is stable for discharge.  Discharged in stable condition.  No signs of infection.  He is without tachycardia and is afebrile.   Final Clinical Impression(s) / ED  Diagnoses Final diagnoses:  Visit for wound check    Rx / DC Orders ED Discharge Orders     None         Marita [place], Cordelia Poche 12/24/23 2141    Linwood Dibbles, MD 12/25/23 [phone]

## 2023-12-24 NOTE — Discharge Instructions (Addendum)
 Follow-up with your surgeon in the clinic.  We cannot remove the screw in the emergency department.  I have attached their address and phone number above.  Please return for any emergent symptoms.  Your blood pressure is elevated.  Ensure you are taking all of your medications like you are supposed to.  Follow-up with your primary care provider.  If you develop any concerning symptoms such as chest pain, shortness of breath, balance issues, vision change return to the emergency room.  ???????? ?? ?????? ?? ???????.  ??? ????? ??????? ???? ?????? ?????. ?? ?????? ????? ??????? ?? ??? ???????.  ???? ?????? ???? ????? ?????.  ??? ??? ?????.  ???? ?? ??? ?????? ???? ?????? ??? ?? ?????.  ???????? ?? ???? ??????? ??????? ????? ??.  ??? ???? ???? ?? ????? ????? ??? ??? ?? ?????? ???? ?? ??????? ?????? ?? ???????? ????? ?? ??????? ????? ??? ???? ???????. almutabaeat mae aljiraah fi aleiadati. la yumkinuna 'iizalat almismar fi qism altawarii. laqad 'urfiqat eunwanuhum waraqm alhatif 'aelahu. yurjaa aleawdat li'ayat 'aerad taria. daght damik murtafieu. ta'akad min 'anak tatanawal jamie 'adwiatik kama hu muftardi. almutabaeat mae muqadam alrieayat al'awaliat [employer]. 'iidha zaharat ealayk 'ayu 'aerad muqliqat mithl 'alam fi alsadri, wadiq fi altanafusi, wamashakil fi altawazuni, wataghayar fi alruwyati, farjie 'iilaa ghurfat altawarii.

## 2024-03-03 DIAGNOSIS — F411 Generalized anxiety disorder: Secondary | ICD-10-CM | POA: Diagnosis not present

## 2024-03-13 DIAGNOSIS — F411 Generalized anxiety disorder: Secondary | ICD-10-CM | POA: Diagnosis not present

## 2024-03-14 DIAGNOSIS — F411 Generalized anxiety disorder: Secondary | ICD-10-CM | POA: Diagnosis not present

## 2024-03-15 DIAGNOSIS — F411 Generalized anxiety disorder: Secondary | ICD-10-CM | POA: Diagnosis not present

## 2024-03-20 DIAGNOSIS — F411 Generalized anxiety disorder: Secondary | ICD-10-CM | POA: Diagnosis not present

## 2024-03-21 DIAGNOSIS — F411 Generalized anxiety disorder: Secondary | ICD-10-CM | POA: Diagnosis not present

## 2024-03-23 DIAGNOSIS — F411 Generalized anxiety disorder: Secondary | ICD-10-CM | POA: Diagnosis not present

## 2024-03-27 DIAGNOSIS — F411 Generalized anxiety disorder: Secondary | ICD-10-CM | POA: Diagnosis not present

## 2024-03-28 DIAGNOSIS — F411 Generalized anxiety disorder: Secondary | ICD-10-CM | POA: Diagnosis not present

## 2024-04-04 DIAGNOSIS — F411 Generalized anxiety disorder: Secondary | ICD-10-CM | POA: Diagnosis not present

## 2024-04-07 DIAGNOSIS — F411 Generalized anxiety disorder: Secondary | ICD-10-CM | POA: Diagnosis not present

## 2024-04-11 DIAGNOSIS — F411 Generalized anxiety disorder: Secondary | ICD-10-CM | POA: Diagnosis not present

## 2024-04-15 DIAGNOSIS — F411 Generalized anxiety disorder: Secondary | ICD-10-CM | POA: Diagnosis not present

## 2024-04-18 DIAGNOSIS — F411 Generalized anxiety disorder: Secondary | ICD-10-CM | POA: Diagnosis not present

## 2024-04-19 DIAGNOSIS — F411 Generalized anxiety disorder: Secondary | ICD-10-CM | POA: Diagnosis not present

## 2024-04-25 DIAGNOSIS — F411 Generalized anxiety disorder: Secondary | ICD-10-CM | POA: Diagnosis not present

## 2024-04-29 DIAGNOSIS — F411 Generalized anxiety disorder: Secondary | ICD-10-CM | POA: Diagnosis not present

## 2024-05-01 ENCOUNTER — Emergency Department (HOSPITAL_COMMUNITY)
Admission: EM | Admit: 2024-05-01 | Discharge: 2024-05-01 | Disposition: A | Discharge status: Home / Self Care | Attending: Emergency Medicine | Admitting: Emergency Medicine

## 2024-05-01 ENCOUNTER — Encounter (HOSPITAL_COMMUNITY): Payer: Self-pay

## 2024-05-01 DIAGNOSIS — Z5321 Procedure and treatment not carried out due to patient leaving prior to being seen by health care provider: Secondary | ICD-10-CM | POA: Diagnosis not present

## 2024-05-01 DIAGNOSIS — K0889 Other specified disorders of teeth and supporting structures: Secondary | ICD-10-CM | POA: Diagnosis not present

## 2024-05-01 DIAGNOSIS — F411 Generalized anxiety disorder: Secondary | ICD-10-CM | POA: Diagnosis not present

## 2024-05-01 NOTE — ED Triage Notes (Signed)
 Stating he needs to have an abscess taking it out and stating a box fell on his eyes and sometimes feels pain.

## 2024-05-01 NOTE — ED Notes (Signed)
 Pt states he has things to do and will come back later to be seen, its too busy

## 2024-05-02 DIAGNOSIS — F411 Generalized anxiety disorder: Secondary | ICD-10-CM | POA: Diagnosis not present

## 2024-05-22 DIAGNOSIS — F331 Major depressive disorder, recurrent, moderate: Secondary | ICD-10-CM | POA: Diagnosis not present

## 2024-05-29 DIAGNOSIS — F331 Major depressive disorder, recurrent, moderate: Secondary | ICD-10-CM | POA: Diagnosis not present

## 2024-06-05 DIAGNOSIS — F331 Major depressive disorder, recurrent, moderate: Secondary | ICD-10-CM | POA: Diagnosis not present

## 2024-06-12 DIAGNOSIS — F331 Major depressive disorder, recurrent, moderate: Secondary | ICD-10-CM | POA: Diagnosis not present

## 2024-06-19 DIAGNOSIS — F331 Major depressive disorder, recurrent, moderate: Secondary | ICD-10-CM | POA: Diagnosis not present

## 2024-06-26 DIAGNOSIS — F331 Major depressive disorder, recurrent, moderate: Secondary | ICD-10-CM | POA: Diagnosis not present

## 2024-07-03 DIAGNOSIS — F331 Major depressive disorder, recurrent, moderate: Secondary | ICD-10-CM | POA: Diagnosis not present

## 2024-07-06 ENCOUNTER — Telehealth: Payer: Self-pay

## 2024-07-06 DIAGNOSIS — F419 Anxiety disorder, unspecified: Secondary | ICD-10-CM

## 2024-07-06 NOTE — Progress Notes (Signed)
 Complex Care Management Note Care Guide Note  07/06/2024 Name: Marti Acebo MRN: [id number] DOB: Jan 08, 1971   Complex Care Management Outreach Attempts: An unsuccessful telephone outreach was attempted today to offer the patient information about available complex care management services.  Follow Up Plan:  Additional outreach attempts will be made to offer the patient complex care management information and services.   Encounter Outcome:  No Answer-Reached out to Interpreter line to have an Clinical research associate call Patient to schedule with RNCM from health Plan referral. No Interpreters were available  Leotis Rase [facility], [facility] Guide  Direct Dial: [phone]  Fax [phone]

## 2024-07-10 DIAGNOSIS — F331 Major depressive disorder, recurrent, moderate: Secondary | ICD-10-CM | POA: Diagnosis not present

## 2024-07-11 NOTE — Progress Notes (Unsigned)
 Complex Care Management Note Care Guide Note  07/11/2024 Name: Edward Mooney MRN: [id number] DOB: 01-09-1971   Complex Care Management Outreach Attempts: A second unsuccessful outreach was attempted today to offer the patient with information about available complex care management services.  Follow Up Plan:  Additional outreach attempts will be made to offer the patient complex care management information and services.   Encounter Outcome:  No Answer-No Voicemail  Leotis Rase [facility], [facility] Guide  Direct Dial: [phone]  Fax [phone]

## 2024-07-13 NOTE — Progress Notes (Signed)
 Complex Care Management Note Care Guide Note  07/13/2024 Name: Edward Mooney MRN: [id number] DOB: 09-Nov-1970   Complex Care Management Outreach Attempts: A third unsuccessful outreach was attempted today to offer the patient with information about available complex care management services.  Follow Up Plan:  No further outreach attempts will be made at this time. We have been unable to contact the patient to offer or enroll patient in complex care management services.  Encounter Outcome:  No Answer- No voicemail-Unable to leave message.  Leotis Rase [facility], [facility] Guide  Direct Dial: [phone]  Fax [phone]

## 2024-07-17 DIAGNOSIS — F331 Major depressive disorder, recurrent, moderate: Secondary | ICD-10-CM | POA: Diagnosis not present

## 2024-07-24 DIAGNOSIS — F331 Major depressive disorder, recurrent, moderate: Secondary | ICD-10-CM | POA: Diagnosis not present

## 2024-08-28 DIAGNOSIS — F331 Major depressive disorder, recurrent, moderate: Secondary | ICD-10-CM | POA: Diagnosis not present

## 2024-09-04 DIAGNOSIS — F331 Major depressive disorder, recurrent, moderate: Secondary | ICD-10-CM | POA: Diagnosis not present

## 2024-09-11 DIAGNOSIS — F331 Major depressive disorder, recurrent, moderate: Secondary | ICD-10-CM | POA: Diagnosis not present

## 2024-09-18 DIAGNOSIS — F331 Major depressive disorder, recurrent, moderate: Secondary | ICD-10-CM | POA: Diagnosis not present

## 2024-09-25 DIAGNOSIS — F331 Major depressive disorder, recurrent, moderate: Secondary | ICD-10-CM | POA: Diagnosis not present

## 2024-10-02 DIAGNOSIS — F331 Major depressive disorder, recurrent, moderate: Secondary | ICD-10-CM | POA: Diagnosis not present

## 2024-10-02 DIAGNOSIS — F411 Generalized anxiety disorder: Secondary | ICD-10-CM | POA: Diagnosis not present

## 2024-10-04 DIAGNOSIS — F411 Generalized anxiety disorder: Secondary | ICD-10-CM | POA: Diagnosis not present

## 2024-10-09 DIAGNOSIS — F331 Major depressive disorder, recurrent, moderate: Secondary | ICD-10-CM | POA: Diagnosis not present

## 2024-10-16 DIAGNOSIS — F331 Major depressive disorder, recurrent, moderate: Secondary | ICD-10-CM | POA: Diagnosis not present

## 2024-10-23 DIAGNOSIS — F331 Major depressive disorder, recurrent, moderate: Secondary | ICD-10-CM | POA: Diagnosis not present

## 2024-11-21 ENCOUNTER — Other Ambulatory Visit: Payer: Self-pay

## 2024-11-21 ENCOUNTER — Encounter: Payer: Self-pay | Admitting: Family Medicine

## 2024-11-21 ENCOUNTER — Ambulatory Visit: Payer: Self-pay | Admitting: Family Medicine

## 2024-11-21 ENCOUNTER — Other Ambulatory Visit (HOSPITAL_COMMUNITY): Payer: Self-pay

## 2024-11-21 VITALS — BP 204/114 | HR 76 | Temp 97.9°F | Resp 17 | Wt 140.4 lb

## 2024-11-21 DIAGNOSIS — I10 Essential (primary) hypertension: Secondary | ICD-10-CM

## 2024-11-21 LAB — GLUCOSE, POCT (MANUAL RESULT ENTRY): POC Glucose: 101 mg/dL — AB (ref 70–99)

## 2024-11-21 MED ORDER — AMLODIPINE BESYLATE 5 MG PO TABS
5.0000 mg | ORAL_TABLET | Freq: Every day | ORAL | 1 refills | Status: AC
  Filled 2024-11-21: qty 30, 30d supply, fill #0

## 2024-11-21 MED ORDER — LOSARTAN POTASSIUM 50 MG PO TABS
50.0000 mg | ORAL_TABLET | Freq: Every day | ORAL | 1 refills | Status: AC
  Filled 2024-11-21: qty 30, 30d supply, fill #0

## 2024-11-21 MED ORDER — LOSARTAN POTASSIUM 50 MG PO TABS
50.0000 mg | ORAL_TABLET | Freq: Every day | ORAL | 1 refills | Status: DC
  Filled 2024-11-21: qty 30, 30d supply, fill #0

## 2024-11-21 MED ORDER — AMLODIPINE BESYLATE 5 MG PO TABS
5.0000 mg | ORAL_TABLET | Freq: Every day | ORAL | 1 refills | Status: DC
  Filled 2024-11-21: qty 30, 30d supply, fill #0

## 2024-11-21 NOTE — Progress Notes (Signed)
 "  New Patient Office Visit  Subjective    Patient ID: Edward Mooney, male    DOB: 12/03/1970  Age: 54 y.o. MRN: [id number]  CC:  Chief Complaint  Patient presents with   Hypertension    HPI [name] presents to establish care  Hypertension: Patient is here for evaluation of elevated blood pressures.  Age at onset of elevated blood pressure:  in [place] .Cardiac symptoms none. Patient denies none.  Cardiovascular risk factors: advanced age (older than 51 for men, 44 for women), dyslipidemia, family history of premature cardiovascular disease, hypertension, and male gender. Use of agents associated with hypertension: none. History of target organ damage: none. Drinks ETOH hasn't had meds in over 6 months.   Outpatient Encounter Medications as of 11/21/2024  Medication Sig   [DISCONTINUED] losartan  (COZAAR ) 50 MG tablet Take 1 tablet (50 mg total) by mouth daily.   amLODipine  (NORVASC ) 5 MG tablet Take 1 tablet (5 mg total) by mouth daily.   losartan  (COZAAR ) 50 MG tablet Take 1 tablet (50 mg total) by mouth daily.   oxyCODONE  (ROXICODONE ) 5 MG/5ML solution Take 5 mLs (5 mg total) by mouth every 6 (six) hours as needed for severe pain (pain score 7-10).   [DISCONTINUED] amLODipine  (NORVASC ) 5 MG tablet Take 1 tablet (5 mg total) by mouth daily.   [DISCONTINUED] amLODipine  (NORVASC ) 5 MG tablet Take 1 tablet (5 mg total) by mouth daily.   No facility-administered encounter medications on file as of 11/21/2024.    Past Medical History:  Diagnosis Date   Hypertension    Tobacco abuse     Past Surgical History:  Procedure Laterality Date   ABDOMINAL SURGERY     In [place] (2000) in prison, had surgery on abdomen, uncertain if any organs removed, had emesis prior to surgery    ORIF MANDIBULAR FRACTURE N/A 10/05/2023   Procedure: OPEN REDUCTION INTERNAL FIXATION (ORIF) MANDIBULAR FRACTURE;  Surgeon: Roark Rush, MD;  Location: [facility] OR;  Service: ENT;  Laterality:  N/A;   ORIF MANDIBULAR FRACTURE N/A 10/20/2023   Procedure: OPEN REDUCTION INTERNAL FIXATION (ORIF) MANDIBULAR FRACTURE;  Surgeon: Roark Rush, MD;  Location: [facility] OR;  Service: ENT;  Laterality: N/A;   TOOTH EXTRACTION N/A 10/20/2023   Procedure: DENTAL RESTORATION/EXTRACTIONS;  Surgeon: Joanette Soulier, DMD;  Location: [facility] OR;  Service: Oral Surgery;  Laterality: N/A;    History reviewed. No pertinent family history.  Social History   Socioeconomic History   Marital status: Single    Spouse name: Not on file   Number of children: Not on file   Years of education: Not on file   Highest education level: Not on file  Occupational History   Not on file  Tobacco Use   Smoking status: Every Day    Current packs/day: 1.00    Average packs/day: 1 pack/day for 6.1 years (6.1 ttl pk-yrs)    Types: Cigarettes    Start date: 11/02/2018   Smokeless tobacco: Never  Vaping Use   Vaping status: Never Used  Substance and Sexual Activity   Alcohol use: Yes    Comment: 6-7 drinks per week, drinks wine with friends    Drug use: Not Currently   Sexual activity: Not on file  Other Topics Concern   Not on file  Social History Narrative   Refugee Information   Number of Immediate Family Members: 8   Number of Immediate Family Members in [place] : 0   Date of Arrival: 07/26/19  Country of Birth: [place]   Country of Origin: ([place])   Location of Refugee Camp: ([place])   Duration in [place]: 11-15 years   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 Language: [place](Nubian)   Able to Read in Primary Language: Yes   Able to Write in Primary Language: Yes   Education: [employer]   Prior Work: Holiday Representative; Factory; Recruitment consultant   Marital Status: Single   Sexual Activity: No   Tuberculosis Screening Overseas: Negative   Health Department Labs Completed: No   History of Trauma: None   Do You Feel Jumpy or Nervous?: No   Are You Very Watchful or 'Super Alert'?: No    Social Drivers of Health   Tobacco Use: High Risk (11/21/2024)   Patient History    Smoking Tobacco Use: Every Day    Smokeless Tobacco Use: Never    Passive Exposure: Not on file  Financial Resource Strain: Not on file  Food Insecurity: No Food Insecurity (10/20/2023)   Hunger Vital Sign    Worried About Running Out of Food in the Last Year: Never true    Ran Out of Food in the Last Year: Never true  Transportation Needs: No Transportation Needs (10/20/2023)   PRAPARE - Administrator, Civil Service (Medical): No    Lack of Transportation (Non-Medical): No  Recent Concern: Transportation Needs - Unmet Transportation Needs (10/05/2023)   PRAPARE - Administrator, Civil Service (Medical): Yes    Lack of Transportation (Non-Medical): Yes  Physical Activity: Not on file  Stress: Not on file  Social Connections: Not on file  Intimate Partner Violence: Not At Risk (10/20/2023)   Humiliation, Afraid, Rape, and Kick questionnaire    Fear of Current or Ex-Partner: No    Emotionally Abused: No    Physically Abused: No    Sexually Abused: No  Depression (PHQ2-9): Not on file  Alcohol Screen: Not on file  Housing: Low Risk (10/20/2023)   Housing Stability Vital Sign    Unable to Pay for Housing in the Last Year: No    Number of Times Moved in the Last Year: 1    Homeless in the Last Year: No  Recent Concern: Housing - High Risk (10/05/2023)   Housing    Last Housing Risk Score: 2  Utilities: Not At Risk (10/20/2023)   AHC Utilities    Threatened with loss of utilities: No  Health Literacy: Not on file    Review of Systems  Constitutional: Negative.   HENT: Negative.    Eyes: Negative.   Respiratory: Negative.    Cardiovascular: Negative.   Gastrointestinal: Negative.   Musculoskeletal: Negative.   Skin: Negative.   Neurological: Negative.   Endo/Heme/Allergies: Negative.   Psychiatric/Behavioral: Negative.         Drinks some ETOH  All other  systems reviewed and are negative.       Objective    BP (!) 204/114 (BP Location: Left Arm, Patient Position: Sitting, Cuff Size: Normal)   Pulse 76   Temp 97.9 F (36.6 C) (Oral)   Resp 17   Wt 140 lb 6.4 oz (63.7 kg)   SpO2 99%   BMI 19.58 kg/m   Physical Exam Vitals and nursing note reviewed.  Constitutional:      General: He is not in acute distress.    Appearance: Normal appearance. He is normal weight. He is not ill-appearing, toxic-appearing or diaphoretic.  HENT:  Head: Normocephalic.  Cardiovascular:     Rate and Rhythm: Normal rate and regular rhythm.     Pulses: Normal pulses.     Heart sounds: Normal heart sounds.  Pulmonary:     Breath sounds: Normal breath sounds.  Musculoskeletal:        General: Normal range of motion.     Cervical back: Normal range of motion.  Skin:    General: Skin is warm.     Capillary Refill: Capillary refill takes less than 2 seconds.  Neurological:     General: No focal deficit present.     Mental Status: He is alert.  Psychiatric:        Mood and Affect: Mood normal.        Reviewed labs from previous labs from a year ago. Stable Patient did not understand to go pick up medication, he thought AVS was prescription and lost it.  Assessment & Plan:   Problem List Items Addressed This Visit   None Visit Diagnoses       Accelerated hypertension    -  Primary   Relevant Medications   amLODipine  (NORVASC ) 5 MG tablet   losartan  (COZAAR ) 50 MG tablet      No signs/symptoms of hypertensive emergency. Discussed importance of blood pressure control in reducing risk of stroke, MI, kidney disease, and other complications.  Counseled on:  [x]  DASH diet and low sodium intake (<1500-2000 mg/day)  [x]  Regular physical activity (150 minutes/week)  []  Weight management (goal BMI <25 if applicable)  []  Smoking cessation  [x]  Alcohol moderation (<=1 drink/day women, <=2 drinks/day men)  [x]  Home BP monitoring  encouraged  [x]  Patient verbalized understanding   Scheduled to PCP 01/18/25 2:00 PM- [facility].     No follow-ups on file.   BALDWIN RIGGS, NP   "

## 2024-11-21 NOTE — Patient Instructions (Addendum)
 [facility]   [address] [place], [place]

## 2024-11-21 NOTE — Progress Notes (Signed)
 Nursing Intake Note - Returning Patient  [facility]  Chief Complaint: No chief complaint on file.  Living Situation: UNHOUSED Insurance Status:   Hospital Account   Not on file      Patient Status:  []  Returning patient to [facility]  Confirmed demographics and emergency contact info  HIPAA and privacy policies previously reviewed  Consent for digital charting: []  Previously Signed []  Signed Today []  Not Signed  Additional Notes:  Vital signs taken and entered in flowsheet  Interpreter services: []  Needed [x]  Not Needed  Brief SDOH update completed  Referral to provider: [x]  Needed []  Not Needed  RN Interventions Provided Today:  [x]  Health education (e.g., chronic disease, hygiene, nutrition)  []  Wound care performed  []  Flu vaccine administered  [x]  Other: Follow-up appt made with [facility]-[address]

## 2024-11-23 ENCOUNTER — Other Ambulatory Visit: Payer: Self-pay

## 2025-01-18 ENCOUNTER — Ambulatory Visit: Payer: Self-pay | Admitting: Family Medicine
